# Patient Record
Sex: Female | Born: 1935 | Race: Black or African American | Hispanic: No | Smoking: Never smoker
Health system: Southern US, Community
[De-identification: ages and names within clinical notes are randomized; demographics above are authoritative.]

## PROBLEM LIST (undated history)

## (undated) DIAGNOSIS — Z972 Presence of dental prosthetic device (complete) (partial): Secondary | ICD-10-CM

## (undated) DIAGNOSIS — I10 Essential (primary) hypertension: Secondary | ICD-10-CM

## (undated) DIAGNOSIS — R42 Dizziness and giddiness: Secondary | ICD-10-CM

## (undated) DIAGNOSIS — M1990 Unspecified osteoarthritis, unspecified site: Secondary | ICD-10-CM

## (undated) HISTORY — PX: TOTAL HIP ARTHROPLASTY: SHX124

---

## 2004-05-19 ENCOUNTER — Ambulatory Visit: Payer: Self-pay | Admitting: Internal Medicine

## 2006-04-29 ENCOUNTER — Ambulatory Visit: Payer: Self-pay | Admitting: Internal Medicine

## 2007-05-18 ENCOUNTER — Ambulatory Visit: Payer: Self-pay | Admitting: Internal Medicine

## 2008-05-18 ENCOUNTER — Ambulatory Visit: Payer: Self-pay | Admitting: Internal Medicine

## 2008-10-15 ENCOUNTER — Encounter: Admission: RE | Admit: 2008-10-15 | Discharge: 2008-10-15 | Payer: Self-pay | Admitting: Rheumatology

## 2009-05-20 ENCOUNTER — Ambulatory Visit: Payer: Self-pay | Admitting: Internal Medicine

## 2010-05-26 ENCOUNTER — Ambulatory Visit: Payer: Self-pay | Admitting: Internal Medicine

## 2011-06-25 ENCOUNTER — Ambulatory Visit: Payer: Self-pay | Admitting: Internal Medicine

## 2012-06-27 ENCOUNTER — Ambulatory Visit: Payer: Self-pay | Admitting: Internal Medicine

## 2012-12-22 ENCOUNTER — Ambulatory Visit: Payer: Self-pay | Admitting: Ophthalmology

## 2013-01-04 ENCOUNTER — Ambulatory Visit: Payer: Self-pay | Admitting: Ophthalmology

## 2013-06-28 ENCOUNTER — Ambulatory Visit: Payer: Self-pay | Admitting: Internal Medicine

## 2014-06-29 ENCOUNTER — Ambulatory Visit: Payer: Self-pay | Admitting: Internal Medicine

## 2014-07-04 ENCOUNTER — Ambulatory Visit: Payer: Self-pay | Admitting: Internal Medicine

## 2014-11-16 NOTE — Op Note (Signed)
PATIENT NAME:  Regina FiedlerRILEY, Haddie R MR#:  [id number] DATE OF BIRTH:  1936-04-17  DATE OF PROCEDURE:  01/04/2013  PREOPERATIVE DIAGNOSIS:  Senile cataract, left eye.  POSTOPERATIVE DIAGNOSIS:  Senile cataract, left eye.  PROCEDURE:  Phacoemulsification with posterior chamber intraocular lens implantation of the left eye.  LENS:  ZCBOO 19.5-diopter posterior chamber intraocular lens.  ULTRASOUND TIME:  19% of 1 minutes, 41 seconds.  CDE 19.7.   SURGEON:  It[place]had Xandria Gallaga, MD  ANESTHESIA:  Topical with tetracaine drops and 2% Xylocaine jelly.  COMPLICATIONS:  None.  DESCRIPTION OF PROCEDURE:  The patient was identified in the holding room and transported to the operating room and placed in the supine position under the operating microscope.  The left eye was identified as the operative eye and it was prepped and draped in the usual sterile ophthalmic fashion.  A 1 millimeter clear-corneal paracentesis was made at the 1:30 position.  The anterior chamber was filled with Viscoat viscoelastic.  A 2.4 millimeter keratome was used to make a near-clear corneal incision at the 10:30 position.  A curvilinear capsulorrhexis was made with a cystotome and capsulorrhexis forceps.  Balanced salt solution was used to hydrodissect and hydrodelineate the nucleus.  Phacoemulsification was then used in stop and chop fashion to remove the lens nucleus and epinucleus.  The remaining cortex was then removed using the irrigation and aspiration handpiece. Provisc was then placed into the capsular bag to distend it for lens placement.  A ZCBOO 19.5 diopter lens was then injected into the capsular bag.  The remaining viscoelastic was aspirated.  Wounds were hydrated with balanced salt solution.  The anterior chamber was inflated to a physiologic pressure with balanced salt solution.  0.1 mL of cefuroxime 10 mg/mL were injected into the anterior chamber for a dose of 1 mg of intracameral antibiotic at the completion of the  case. Miostat was placed into the anterior chamber to constrict the pupil.  No wound leaks were noted.  Topical Vigamox drops and Maxitrol ointment were applied to the eye.  The patient was taken to the recovery room in stable condition without complications of anesthesia or surgery.    ____________________________ Deirdre Evenerhadwick R. Chanin Frumkin, MD crb:ea D: 01/04/2013 13:54:00 ET T: 01/04/2013 16:35:15 ET JOB#: [id number]  cc: Deirdre Evenerhadwick R. Glenyce Randle, MD, <Dictator>   Lockie MolaHADWICK Quran Vasco MD ELECTRONICALLY SIGNED 01/11/2013 12:09

## 2015-06-13 ENCOUNTER — Other Ambulatory Visit: Payer: Self-pay | Admitting: Internal Medicine

## 2015-06-13 DIAGNOSIS — Z1231 Encounter for screening mammogram for malignant neoplasm of breast: Secondary | ICD-10-CM

## 2015-07-09 ENCOUNTER — Ambulatory Visit
Admission: RE | Admit: 2015-07-09 | Discharge: 2015-07-09 | Disposition: A | Discharge status: Home / Self Care | Payer: Medicare Other | Source: Ambulatory Visit | Attending: Internal Medicine | Admitting: Internal Medicine

## 2015-07-09 ENCOUNTER — Other Ambulatory Visit: Payer: Self-pay | Admitting: Internal Medicine

## 2015-07-09 DIAGNOSIS — Z1231 Encounter for screening mammogram for malignant neoplasm of breast: Secondary | ICD-10-CM | POA: Diagnosis not present

## 2016-05-15 ENCOUNTER — Other Ambulatory Visit: Payer: Self-pay | Admitting: Internal Medicine

## 2016-05-15 DIAGNOSIS — Z1231 Encounter for screening mammogram for malignant neoplasm of breast: Secondary | ICD-10-CM

## 2016-05-20 IMAGING — US US BREAST*L* LIMITED INC AXILLA
1 series · 1 of 1 positions shown · non-contrast
Comparison: Prior mammograms

CLINICAL DATA: Questioned abnormality on 3D screening mammography
left breast 3 o'clock location, patient scheduled for ultrasound
only evaluation

EXAM:
ULTRASOUND OF THE left BREAST

[Series 1: us breast*left* limited inc axilla · 0.09mm/px · 1 of 1 slices shown]
[im 1/1]
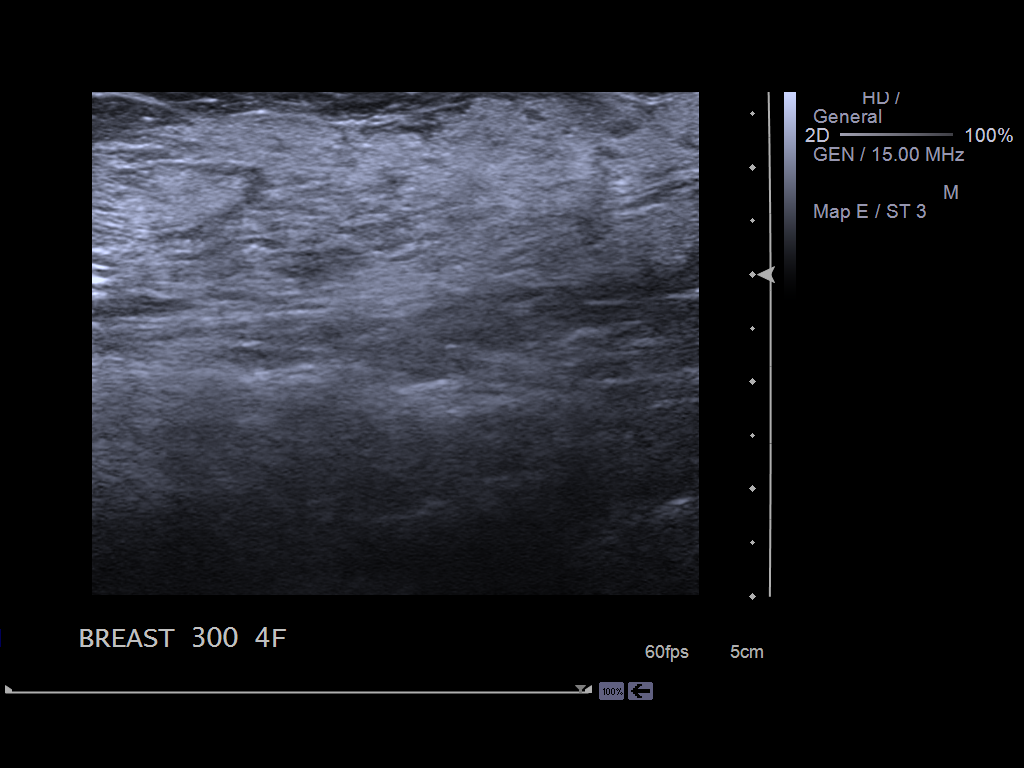

[1 of 1 positions shown; findings below may reference images not displayed]

FINDINGS: On physical exam, I palpate no abnormality in the left breast 3
o'clock location

Ultrasound is performed, showing no sonographic abnormality in the
left breast 3 o'clock location at the site of previously seen
nodular probable breast parenchyma with an overlying pattern of
heterogeneous breast parenchyma.
IMPRESSION: No evidence for malignancy in the left breast.

RECOMMENDATION:
Screening mammogram in one year.(Code:F9-X-W68)

I have discussed the findings and recommendations with the patient.
Results were also provided in writing at the conclusion of the
visit. If applicable, a reminder letter will be sent to the patient
regarding the next appointment.

BI-RADS CATEGORY  1: Negative.

## 2016-07-09 ENCOUNTER — Ambulatory Visit
Admission: RE | Admit: 2016-07-09 | Discharge: 2016-07-09 | Disposition: A | Discharge status: Home / Self Care | Payer: Medicare Other | Source: Ambulatory Visit | Attending: Internal Medicine | Admitting: Internal Medicine

## 2016-07-09 DIAGNOSIS — Z1231 Encounter for screening mammogram for malignant neoplasm of breast: Secondary | ICD-10-CM | POA: Insufficient documentation

## 2017-04-30 ENCOUNTER — Other Ambulatory Visit: Payer: Self-pay | Admitting: Internal Medicine

## 2017-04-30 DIAGNOSIS — Z1231 Encounter for screening mammogram for malignant neoplasm of breast: Secondary | ICD-10-CM

## 2017-08-03 ENCOUNTER — Ambulatory Visit
Admission: RE | Admit: 2017-08-03 | Discharge: 2017-08-03 | Disposition: A | Discharge status: Home / Self Care | Payer: Medicare Other | Source: Ambulatory Visit | Attending: Internal Medicine | Admitting: Internal Medicine

## 2017-08-03 DIAGNOSIS — Z1231 Encounter for screening mammogram for malignant neoplasm of breast: Secondary | ICD-10-CM

## 2018-02-28 ENCOUNTER — Encounter: Payer: Self-pay | Admitting: *Deleted

## 2018-02-28 ENCOUNTER — Other Ambulatory Visit: Payer: Self-pay

## 2018-03-01 ENCOUNTER — Encounter: Payer: Self-pay | Admitting: Anesthesiology

## 2018-03-04 NOTE — Discharge Instructions (Signed)

## 2018-03-08 ENCOUNTER — Ambulatory Visit: Admission: RE | Admit: 2018-03-08 | Payer: Medicare Other | Source: Ambulatory Visit | Admitting: Ophthalmology

## 2018-03-08 HISTORY — DX: Dizziness and giddiness: R42

## 2018-03-08 HISTORY — DX: Presence of dental prosthetic device (complete) (partial): Z97.2

## 2018-03-08 HISTORY — DX: Unspecified osteoarthritis, unspecified site: M19.90

## 2018-03-08 HISTORY — DX: Essential (primary) hypertension: I10

## 2018-03-08 SURGERY — PHACOEMULSIFICATION, CATARACT, WITH IOL INSERTION
Anesthesia: Topical | Laterality: Right

## 2019-02-23 ENCOUNTER — Encounter: Payer: Self-pay | Admitting: Anesthesiology

## 2019-02-23 ENCOUNTER — Encounter: Payer: Self-pay | Admitting: *Deleted

## 2019-02-23 ENCOUNTER — Other Ambulatory Visit: Payer: Self-pay

## 2019-02-23 NOTE — Discharge Instructions (Signed)

## 2019-02-24 ENCOUNTER — Other Ambulatory Visit
Admission: RE | Admit: 2019-02-24 | Discharge: 2019-02-24 | Disposition: A | Discharge status: Home / Self Care | Payer: Medicare Other | Source: Ambulatory Visit | Attending: Ophthalmology | Admitting: Ophthalmology

## 2019-02-24 DIAGNOSIS — Z01812 Encounter for preprocedural laboratory examination: Secondary | ICD-10-CM | POA: Insufficient documentation

## 2019-02-24 DIAGNOSIS — Z20828 Contact with and (suspected) exposure to other viral communicable diseases: Secondary | ICD-10-CM | POA: Diagnosis not present

## 2019-02-24 LAB — SARS CORONAVIRUS 2 (TAT 6-24 HRS): SARS Coronavirus 2: NEGATIVE

## 2019-03-01 ENCOUNTER — Ambulatory Visit: Admission: RE | Admit: 2019-03-01 | Payer: Medicare Other | Source: Home / Self Care | Admitting: Ophthalmology

## 2019-03-01 SURGERY — PHACOEMULSIFICATION, CATARACT, WITH IOL INSERTION
Anesthesia: Topical | Laterality: Right

## 2020-11-08 ENCOUNTER — Ambulatory Visit: Admit: 2020-11-08 | Payer: Medicare Other | Admitting: Ophthalmology

## 2020-11-08 SURGERY — PHACOEMULSIFICATION, CATARACT, WITH IOL INSERTION
Anesthesia: Topical | Laterality: Right

## 2021-02-05 ENCOUNTER — Encounter: Payer: Self-pay | Admitting: Ophthalmology

## 2021-02-17 NOTE — Discharge Instructions (Signed)

## 2021-02-19 ENCOUNTER — Encounter: Payer: Self-pay | Admitting: Ophthalmology

## 2021-02-19 ENCOUNTER — Ambulatory Visit: Payer: Medicare Other | Admitting: Anesthesiology

## 2021-02-19 ENCOUNTER — Other Ambulatory Visit: Payer: Self-pay

## 2021-02-19 ENCOUNTER — Ambulatory Visit
Admission: RE | Admit: 2021-02-19 | Discharge: 2021-02-19 | Disposition: A | Discharge status: Home / Self Care | Payer: Medicare Other | Attending: Ophthalmology | Admitting: Ophthalmology

## 2021-02-19 ENCOUNTER — Encounter
Admission: RE | Disposition: A | Discharge status: Home / Self Care | Payer: Self-pay | Source: Home / Self Care | Attending: Ophthalmology

## 2021-02-19 DIAGNOSIS — Z791 Long term (current) use of non-steroidal anti-inflammatories (NSAID): Secondary | ICD-10-CM | POA: Insufficient documentation

## 2021-02-19 DIAGNOSIS — Z96642 Presence of left artificial hip joint: Secondary | ICD-10-CM | POA: Diagnosis not present

## 2021-02-19 DIAGNOSIS — H2589 Other age-related cataract: Secondary | ICD-10-CM | POA: Diagnosis present

## 2021-02-19 DIAGNOSIS — Z7952 Long term (current) use of systemic steroids: Secondary | ICD-10-CM | POA: Diagnosis not present

## 2021-02-19 DIAGNOSIS — Z7984 Long term (current) use of oral hypoglycemic drugs: Secondary | ICD-10-CM | POA: Insufficient documentation

## 2021-02-19 HISTORY — PX: CATARACT EXTRACTION W/PHACO: SHX586

## 2021-02-19 LAB — GLUCOSE, CAPILLARY: Glucose-Capillary: 108 mg/dL — ABNORMAL HIGH (ref 70–99)

## 2021-02-19 SURGERY — PHACOEMULSIFICATION, CATARACT, WITH IOL INSERTION
Anesthesia: Monitor Anesthesia Care | Site: Eye | Laterality: Right

## 2021-02-19 MED ORDER — SIGHTPATH DOSE#1 BSS IO SOLN
INTRAOCULAR | Status: DC | PRN
  Administered 2021-02-19: 2 mL

## 2021-02-19 MED ORDER — PHENYLEPHRINE HCL 10 % OP SOLN
1.0000 [drp] | OPHTHALMIC | Status: DC | PRN
  Administered 2021-02-19 (×3): 1 [drp] via OPHTHALMIC

## 2021-02-19 MED ORDER — CEFUROXIME OPHTHALMIC INJECTION 1 MG/0.1 ML
INJECTION | OPHTHALMIC | Status: DC | PRN
  Administered 2021-02-19: 0.1 mL via INTRACAMERAL

## 2021-02-19 MED ORDER — MIDAZOLAM HCL 2 MG/2ML IJ SOLN
INTRAMUSCULAR | Status: DC | PRN
  Administered 2021-02-19: .5 mg via INTRAVENOUS

## 2021-02-19 MED ORDER — SIGHTPATH DOSE#1 BSS IO SOLN
INTRAOCULAR | Status: DC | PRN
  Administered 2021-02-19: 132 mL via OPHTHALMIC

## 2021-02-19 MED ORDER — SIGHTPATH DOSE#1 BSS IO SOLN
INTRAOCULAR | Status: DC | PRN
  Administered 2021-02-19: 15 mL via INTRAOCULAR

## 2021-02-19 MED ORDER — BRIMONIDINE TARTRATE-TIMOLOL 0.2-0.5 % OP SOLN
OPHTHALMIC | Status: DC | PRN
  Administered 2021-02-19: 1 [drp] via OPHTHALMIC

## 2021-02-19 MED ORDER — SIGHTPATH DOSE#1 NA HYALUR & NA CHOND-NA HYALUR IO KIT
PACK | INTRAOCULAR | Status: DC | PRN
  Administered 2021-02-19: 1 via OPHTHALMIC

## 2021-02-19 MED ORDER — FENTANYL CITRATE (PF) 100 MCG/2ML IJ SOLN
INTRAMUSCULAR | Status: DC | PRN
  Administered 2021-02-19: 50 ug via INTRAVENOUS

## 2021-02-19 MED ORDER — TETRACAINE HCL 0.5 % OP SOLN
1.0000 [drp] | OPHTHALMIC | Status: DC | PRN
  Administered 2021-02-19 (×3): 1 [drp] via OPHTHALMIC

## 2021-02-19 MED ORDER — LACTATED RINGERS IV SOLN: INTRAVENOUS | Status: DC

## 2021-02-19 MED ORDER — TRYPAN BLUE 0.06 % OP SOLN
OPHTHALMIC | Status: DC | PRN
  Administered 2021-02-19: 0.5 mL via INTRAOCULAR

## 2021-02-19 MED ORDER — SIGHTPATH DOSE#1 SODIUM HYALURONATE 23 MG/ML IO SOLUTION
PREFILLED_SYRINGE | INTRAOCULAR | Status: DC | PRN
  Administered 2021-02-19: 0.55 mL via INTRAOCULAR

## 2021-02-19 MED ORDER — BSS IO SOLN
INTRAOCULAR | Status: DC | PRN
  Administered 2021-02-19 (×2): 15 mL via INTRAOCULAR

## 2021-02-19 MED ORDER — CYCLOPENTOLATE HCL 2 % OP SOLN
1.0000 [drp] | OPHTHALMIC | Status: DC | PRN
  Administered 2021-02-19 (×3): 1 [drp] via OPHTHALMIC

## 2021-02-19 SURGICAL SUPPLY — 15 items
CANNULA ANT/CHMB 27GA (MISCELLANEOUS) ×4 IMPLANT
GLOVE SURG GAMMEX PI TX LF 7.5 (GLOVE) ×2 IMPLANT
GLOVE SURG TRIUMPH 8.0 PF LTX (GLOVE) ×2 IMPLANT
GOWN STRL REUS W/ TWL LRG LVL3 (GOWN DISPOSABLE) ×2 IMPLANT
GOWN STRL REUS W/TWL LRG LVL3 (GOWN DISPOSABLE) ×4
LENS IOL TECNIS EYHANCE 20.5 (Intraocular Lens) ×2 IMPLANT
MARKER SKIN DUAL TIP RULER LAB (MISCELLANEOUS) ×2 IMPLANT
NEEDLE CAPSULORHEX 25GA (NEEDLE) ×2 IMPLANT
NEEDLE FILTER BLUNT 18X 1/2SAF (NEEDLE) ×2
NEEDLE FILTER BLUNT 18X1 1/2 (NEEDLE) ×2 IMPLANT
PACK EYE AFTER SURG (MISCELLANEOUS) ×2 IMPLANT
SYR 3ML LL SCALE MARK (SYRINGE) ×4 IMPLANT
SYR TB 1ML LUER SLIP (SYRINGE) ×2 IMPLANT
WATER STERILE IRR 250ML POUR (IV SOLUTION) ×2 IMPLANT
WIPE NON LINTING 3.25X3.25 (MISCELLANEOUS) ×2 IMPLANT

## 2021-02-19 NOTE — Transfer of Care (Signed)
Immediate Anesthesia Transfer of Care Note  Patient: Regina Ross  Procedure(s) Performed: CATARACT EXTRACTION PHACO AND INTRAOCULAR LENS PLACEMENT (IOC) RIGHT HEALON 5 VISION BLUE 38.18 03:28.4 (Right: Eye)  Patient Location: PACU  Anesthesia Type: MAC  Level of Consciousness: awake, alert  and patient cooperative  Airway and Oxygen Therapy: Patient Spontanous Breathing and Patient connected to supplemental oxygen  Post-op Assessment: Post-op Vital signs reviewed, Patient's Cardiovascular Status Stable, Respiratory Function Stable, Patent Airway and No signs of Nausea or vomiting  Post-op Vital Signs: Reviewed and stable  Complications: No notable events documented.

## 2021-02-19 NOTE — Anesthesia Postprocedure Evaluation (Signed)
Anesthesia Post Note  Patient: Regina Ross  Procedure(s) Performed: CATARACT EXTRACTION PHACO AND INTRAOCULAR LENS PLACEMENT (IOC) RIGHT HEALON 5 VISION BLUE 38.18 03:28.4 (Right: Eye)     Patient location during evaluation: PACU Anesthesia Type: MAC Level of consciousness: awake and alert Pain management: pain level controlled Vital Signs Assessment: post-procedure vital signs reviewed and stable Respiratory status: spontaneous breathing Cardiovascular status: stable Anesthetic complications: no   No notable events documented.  Gillian Scarce

## 2021-02-19 NOTE — H&P (Signed)
[facility]   Primary Care Physician:  Armando Gang, FNP Ophthalmologist: Dr. Lockie Mola  Pre-Procedure History & Physical: HPI:  Regina Ross is a 85 y.o. female here for ophthalmic surgery.   Past Medical History:  Diagnosis Date   Arthritis    right knee, right hip   Hypertension    Vertigo    Wears dentures    full upper and lower    Past Surgical History:  Procedure Laterality Date   TOTAL HIP ARTHROPLASTY Left     Prior to Admission medications   Medication Sig Start Date End Date Taking? Authorizing Provider  atenolol (TENORMIN) 50 MG tablet Take 50 mg by mouth daily.   Yes [provider]  Cyanocobalamin (B-12 PO) Take 3,000 mg by mouth daily.   Yes [provider]  dapagliflozin propanediol (FARXIGA) 5 MG TABS tablet Take 5 mg by mouth daily.   Yes [provider]  doxazosin (CARDURA) 4 MG tablet Take 4 mg by mouth daily.   Yes [provider]  etodolac (LODINE) 200 MG capsule Take 200 mg by mouth 2 (two) times daily.   Yes [provider]  meclizine (ANTIVERT) 25 MG tablet Take 25 mg by mouth daily.   Yes [provider]  naproxen (NAPROSYN) 500 MG tablet Take 500 mg by mouth 2 (two) times daily with a meal.   Yes [provider]  simvastatin (ZOCOR) 20 MG tablet Take 20 mg by mouth daily.   Yes [provider]  TIMOLOL MALEATE OP Apply to eye 2 (two) times daily.   Yes [provider]  VITAMIN D PO Take by mouth daily.   Yes [provider]  predniSONE (DELTASONE) 2.5 MG tablet Take 2.5 mg by mouth daily with breakfast. Patient not taking: Reported on 02/05/2021    [provider]    Allergies as of 01/08/2021   (No Known Allergies)    Family History  Problem Relation Age of Onset   Breast cancer Mother 70    Social History   Socioeconomic History   Marital status: Widowed    Spouse name: Not on file   Number of children: Not on  file   Years of education: Not on file   Highest education level: Not on file  Occupational History   Not on file  Tobacco Use   Smoking status: Never   Smokeless tobacco: Never  Vaping Use   Vaping Use: Never used  Substance and Sexual Activity   Alcohol use: Never   Drug use: Not on file   Sexual activity: Not on file  Other Topics Concern   Not on file  Social History Narrative   Not on file   Social Determinants of Health   Financial Resource Strain: Not on file  Food Insecurity: Not on file  Transportation Needs: Not on file  Physical Activity: Not on file  Stress: Not on file  Social Connections: Not on file  Intimate Partner Violence: Not on file    Review of Systems: See HPI, otherwise negative ROS  Physical Exam: BP (!) 194/78   Pulse 63   Temp (!) 97.5 F (36.4 C)   Ht 5\' 7"  (1.702 m)   Wt 73.9 kg   SpO2 100%   BMI 25.53 kg/m  General:   Alert,  pleasant and cooperative in NAD Head:  Normocephalic and atraumatic. Lungs:  Clear to auscultation.    Heart:  Regular rate and rhythm. Gr IV murmur  Impression/Plan: Regina Ross is here for ophthalmic surgery.  Risks, benefits, limitations, and alternatives regarding ophthalmic surgery have been reviewed with the patient.  Questions have been answered.  All parties agreeable.   Lockie Mola, MD  02/19/2021, 8:53 AM

## 2021-02-19 NOTE — Anesthesia Procedure Notes (Signed)
Procedure Name: MAC Date/Time: 02/19/2021 9:24 AM Performed by: Dionne Bucy, CRNA Pre-anesthesia Checklist: Patient identified, Emergency Drugs available, Suction available, Patient being monitored and Timeout performed Patient Re-evaluated:Patient Re-evaluated prior to induction Oxygen Delivery Method: Nasal cannula Placement Confirmation: positive ETCO2

## 2021-02-19 NOTE — Op Note (Signed)
OPERATIVE NOTE  Regina Ross [id number] 02/19/2021   PREOPERATIVE DIAGNOSIS:  H25.89 Cataract           Mature (Total) Cataract Right Eye H25.89   POSTOPERATIVE DIAGNOSIS: H25.89 Cataract           Mature (Total) Cataract Right Eye H25.89          PROCEDURE:  Phacoemusification with posterior chamber intraocular lens placement of the right eye .  Vision Blue dye was used to stain the lens capsule.  LENS:   Implant Name Type Inv. Item Serial No. Manufacturer Lot No. LRB No. Used Action  LENS IOL TECNIS EYHANCE 20.5 - [id number] Intraocular Lens LENS IOL TECNIS EYHANCE 20.5 [id number] [id number]   Right 1 Implanted       ULTRASOUND TIME:  3 minutes 28 seconds, CDE 38.2  SURGEON:  Deirdre Evener, MD   ANESTHESIA:  Topical with tetracaine drops and 2% Xylocaine jelly, augmented with 1% preservative-free intracameral lidocaine.       COMPLICATIONS:  None.   DESCRIPTION OF PROCEDURE:  The patient was identified in the holding room and transported to the operating room and placed in the supine position under the operating microscope.  The right eye was identified as the operative eye and it was prepped and draped in the usual sterile ophthalmic fashion.    A 1 millimeter clear-corneal paracentesis was made at the 12:00 position.  0.5 ml of preservative-free 1% lidocaine was injected into the anterior chamber. The anterior chamber was filled with Healon 5 viscoelastic.  A 2.4 millimeter keratome was used to make a near-clear corneal incision at the 9:00 position.  The anterior chamber was filled with Healon 5 viscoelastic.  Vision Blue dye was then injected under the viscoelastic to stain the lens capsule.  BSS was then used to wash the dye out.  Additional Healon 5 was placed into the anterior chamber.  A curvilinear capsulorrhexis was made with a cystotome and capsulorrhexis forceps.  Balanced salt solution was used to hydrodissect and hydrodelineate the nucleus.  Viscoat was  then placed in the anterior chamber.   Phacoemulsification was then used in stop and chop fashion to remove the lens nucleus and epinucleus.  The remaining cortex was then removed using the irrigation and aspiration handpiece. Provisc was then placed into the capsular bag to distend it for lens placement.  A 20.5 -diopter lens was then injected into the capsular bag.  The remaining viscoelastic was aspirated.   Wounds were hydrated with balanced salt solution.  The anterior chamber was inflated to a physiologic pressure with balanced salt solution. Cefuroxime 0.1 ml of a 10mg /ml solution was injected into the anterior chamber for a dose of 1 mg of intracameral antibiotic at the completion of the case.  No wound leaks were noted.  Topical combigan drops were applied to the eye.  The patient was taken to the recovery room in stable condition without complications of anesthesia or surgery.  [name] 02/19/2021, 9:48 AM

## 2021-02-19 NOTE — Anesthesia Preprocedure Evaluation (Addendum)
Anesthesia Evaluation  Patient identified by MRN, date of birth, ID band Patient awake    Reviewed: Allergy & Precautions, H&P , NPO status , Patient's Chart, lab work & pertinent test results  Airway Mallampati: II  TM Distance: >3 FB Neck ROM: full    Dental no notable dental hx.    Pulmonary neg pulmonary ROS,    Pulmonary exam normal        Cardiovascular hypertension, On Medications  Rhythm:regular Rate:Normal + Systolic murmurs    Neuro/Psych negative neurological ROS  negative psych ROS   GI/Hepatic negative GI ROS, Neg liver ROS,   Endo/Other  negative endocrine ROS  Renal/GU negative Renal ROS  negative genitourinary   Musculoskeletal   Abdominal   Peds  Hematology negative hematology ROS (+)   Anesthesia Other Findings   Reproductive/Obstetrics                            Anesthesia Physical Anesthesia Plan  ASA: 2  Anesthesia Plan: MAC   Post-op Pain Management:    Induction:   PONV Risk Score and Plan: 2 and Treatment may vary due to age or medical condition and Midazolam  Airway Management Planned:   Additional Equipment:   Intra-op Plan:   Post-operative Plan:   Informed Consent: I have reviewed the patients History and Physical, chart, labs and discussed the procedure including the risks, benefits and alternatives for the proposed anesthesia with the patient or authorized representative who has indicated his/her understanding and acceptance.       Plan Discussed with:   Anesthesia Plan Comments:         Anesthesia Quick Evaluation

## 2021-02-20 ENCOUNTER — Encounter: Payer: Self-pay | Admitting: Ophthalmology

## 2021-02-20 MED ORDER — SODIUM HYALURONATE 23MG/ML IO SOSY
PREFILLED_SYRINGE | INTRAOCULAR | Status: DC | PRN
  Administered 2021-02-19: 0.6 mL via INTRAOCULAR

## 2021-02-20 NOTE — OR Nursing (Signed)
Changed Sightpath healon 5 dose #1 to the chargeable Healon 5 code.

## 2022-01-14 ENCOUNTER — Ambulatory Visit: Payer: Medicare Other | Admitting: Internal Medicine

## 2023-11-09 ENCOUNTER — Other Ambulatory Visit: Payer: Self-pay

## 2023-11-09 ENCOUNTER — Emergency Department

## 2023-11-09 ENCOUNTER — Emergency Department
Admission: EM | Admit: 2023-11-09 | Discharge: 2023-11-09 | Disposition: A | Discharge status: Home / Self Care | Attending: Emergency Medicine | Admitting: Emergency Medicine

## 2023-11-09 DIAGNOSIS — I491 Atrial premature depolarization: Secondary | ICD-10-CM | POA: Diagnosis not present

## 2023-11-09 DIAGNOSIS — R410 Disorientation, unspecified: Secondary | ICD-10-CM | POA: Diagnosis not present

## 2023-11-09 DIAGNOSIS — R471 Dysarthria and anarthria: Secondary | ICD-10-CM | POA: Diagnosis not present

## 2023-11-09 DIAGNOSIS — I700 Atherosclerosis of aorta: Secondary | ICD-10-CM | POA: Diagnosis not present

## 2023-11-09 DIAGNOSIS — R4182 Altered mental status, unspecified: Secondary | ICD-10-CM | POA: Diagnosis present

## 2023-11-09 DIAGNOSIS — R4701 Aphasia: Secondary | ICD-10-CM | POA: Insufficient documentation

## 2023-11-09 DIAGNOSIS — Z8673 Personal history of transient ischemic attack (TIA), and cerebral infarction without residual deficits: Secondary | ICD-10-CM | POA: Diagnosis not present

## 2023-11-09 DIAGNOSIS — Z993 Dependence on wheelchair: Secondary | ICD-10-CM | POA: Insufficient documentation

## 2023-11-09 DIAGNOSIS — R531 Weakness: Secondary | ICD-10-CM | POA: Diagnosis not present

## 2023-11-09 DIAGNOSIS — E119 Type 2 diabetes mellitus without complications: Secondary | ICD-10-CM | POA: Insufficient documentation

## 2023-11-09 DIAGNOSIS — I10 Essential (primary) hypertension: Secondary | ICD-10-CM | POA: Insufficient documentation

## 2023-11-09 DIAGNOSIS — R000 Tachycardia, unspecified: Secondary | ICD-10-CM | POA: Insufficient documentation

## 2023-11-09 DIAGNOSIS — I6782 Cerebral ischemia: Secondary | ICD-10-CM | POA: Insufficient documentation

## 2023-11-09 LAB — URINE DRUG SCREEN, QUALITATIVE (ARMC ONLY)
Amphetamines, Ur Screen: NOT DETECTED
Barbiturates, Ur Screen: NOT DETECTED
Benzodiazepine, Ur Scrn: NOT DETECTED
Cannabinoid 50 Ng, Ur ~~LOC~~: NOT DETECTED
Cocaine Metabolite,Ur ~~LOC~~: NOT DETECTED
MDMA (Ecstasy)Ur Screen: NOT DETECTED
Methadone Scn, Ur: NOT DETECTED
Opiate, Ur Screen: NOT DETECTED
Phencyclidine (PCP) Ur S: NOT DETECTED
Tricyclic, Ur Screen: NOT DETECTED

## 2023-11-09 LAB — URINALYSIS, W/ REFLEX TO CULTURE (INFECTION SUSPECTED)
Bacteria, UA: NONE SEEN
Bilirubin Urine: NEGATIVE
Glucose, UA: NEGATIVE mg/dL
Ketones, ur: 5 mg/dL — AB
Leukocytes,Ua: NEGATIVE
Nitrite: NEGATIVE
Protein, ur: NEGATIVE mg/dL
Specific Gravity, Urine: 1.044 — ABNORMAL HIGH (ref 1.005–1.030)
pH: 6 (ref 5.0–8.0)

## 2023-11-09 LAB — ETHANOL: Alcohol, Ethyl (B): <10 mg/dL (ref <10)

## 2023-11-09 LAB — CBC WITH DIFFERENTIAL/PLATELET
Abs Immature Granulocytes: 0.07 10*3/uL (ref 0.00–0.07)
Basophils Absolute: 0 10*3/uL (ref 0.0–0.1)
Basophils Relative: 0 %
Eosinophils Absolute: 0 10*3/uL (ref 0.0–0.5)
Eosinophils Relative: 0 %
HCT: 36.6 % (ref 36.0–46.0)
Hemoglobin: 12.3 g/dL (ref 12.0–15.0)
Immature Granulocytes: 1 %
Lymphocytes Relative: 11 %
Lymphs Abs: 1.1 10*3/uL (ref 0.7–4.0)
MCH: 31.4 pg (ref 26.0–34.0)
MCHC: 33.6 g/dL (ref 30.0–36.0)
MCV: 93.4 fL (ref 80.0–100.0)
Monocytes Absolute: 1.2 10*3/uL — ABNORMAL HIGH (ref 0.1–1.0)
Monocytes Relative: 11 %
Neutro Abs: 8 10*3/uL — ABNORMAL HIGH (ref 1.7–7.7)
Neutrophils Relative %: 77 %
Platelets: 232 10*3/uL (ref 150–400)
RBC: 3.92 MIL/uL (ref 3.87–5.11)
RDW: 12.9 % (ref 11.5–15.5)
WBC: 10.4 10*3/uL (ref 4.0–10.5)
nRBC: 0 % (ref 0.0–0.2)

## 2023-11-09 LAB — RESP PANEL BY RT-PCR (RSV, FLU A&B, COVID)  RVPGX2
Influenza A by PCR: NEGATIVE
Influenza B by PCR: NEGATIVE
Resp Syncytial Virus by PCR: NEGATIVE
SARS Coronavirus 2 by RT PCR: NEGATIVE

## 2023-11-09 LAB — COMPREHENSIVE METABOLIC PANEL WITH GFR
ALT: 10 U/L (ref 0–44)
AST: 23 U/L (ref 15–41)
Albumin: 3.7 g/dL (ref 3.5–5.0)
Alkaline Phosphatase: 58 U/L (ref 38–126)
Anion gap: 11 (ref 5–15)
BUN: 17 mg/dL (ref 8–23)
CO2: 24 mmol/L (ref 22–32)
Calcium: 9.4 mg/dL (ref 8.9–10.3)
Chloride: 103 mmol/L (ref 98–111)
Creatinine, Ser: 1.05 mg/dL — ABNORMAL HIGH (ref 0.44–1.00)
GFR, Estimated: 52 mL/min — ABNORMAL LOW (ref >60)
Glucose, Bld: 166 mg/dL — ABNORMAL HIGH (ref 70–99)
Potassium: 4 mmol/L (ref 3.5–5.1)
Sodium: 138 mmol/L (ref 135–145)
Total Bilirubin: 1.2 mg/dL (ref 0.0–1.2)
Total Protein: 7.7 g/dL (ref 6.5–8.1)

## 2023-11-09 LAB — APTT: aPTT: 28 s (ref 24–36)

## 2023-11-09 LAB — PROTIME-INR
INR: 1.1 (ref 0.8–1.2)
Prothrombin Time: 14.5 s (ref 11.4–15.2)

## 2023-11-09 LAB — PROCALCITONIN: Procalcitonin: <0.1 ng/mL

## 2023-11-09 MED ORDER — IOHEXOL 350 MG/ML SOLN
100.0000 mL | Freq: Once | INTRAVENOUS | Status: AC | PRN
Start: 2023-11-09 — End: 2023-11-09
  Administered 2023-11-09: 100 mL via INTRAVENOUS

## 2023-11-09 MED ORDER — LACTATED RINGERS IV BOLUS
1000.0000 mL | Freq: Once | INTRAVENOUS | Status: AC
  Administered 2023-11-09: 1000 mL via INTRAVENOUS

## 2023-11-09 NOTE — Code Documentation (Signed)
 Stroke Response Nurse Documentation Code Documentation  Regina Ross is a 87 y.o. female arriving to [facility] via [facility] on 11/09/2023  with past medical hx of HTN, vertigo, right eye cataract extraction, wheelchair bound. On No antithrombotic. Code stroke was activated by ED.   Patient from home where she was LKW at 2130 on 11/08/2023 and now complaining of speech difficulties . Per nephew, patient was last seen in her usual state of health last night. This morning, family noticed that she wasn't making sense as she normally does.  Family reports history of right leg pain, being followed by ortho.  Stroke team at the bedside on patient arrival. Labs drawn and patient cleared for CT by Dr. Angus Kenning. Patient to CT with team. NIHSS 6, see documentation for details and code stroke times. Patient with disoriented, right gaze preference , right leg weakness, and Expressive aphasia  on exam. The following imaging was completed:  CT Head, CTA, and CTP. Patient is not a candidate for IV Thrombolytic due to outside window, per MD. Patient is not a candidate for IR due to no LVO on imaging, per MD.   Care Plan: every 2 hour NIHSS and vital signs. Swallow screen per protocol.   Process Delays Noted: none  Bedside handoff with ED RN Kay Parson  Stroke Response RN

## 2023-11-09 NOTE — ED Triage Notes (Signed)
 Pt here via [facility] with AMS, LKW was last night before bed at 2100. Pt was found sitting on her portable commode for 2 hrs by family. Pt currently has 2 bleeding bed sores per ems. Pt alert to name and place but not time. Pt states she has a hx of a stroke she thinks.   97.2 120 HR 97% RA 30-co2 25-30 RR 170-cbg

## 2023-11-09 NOTE — ED Provider Notes (Signed)
 [facility] Provider Note    Event Date/Time   First MD Initiated Contact with Patient 11/09/23 1034     (approximate)   History   Chief Complaint: Altered Mental Status   HPI  Regina Ross is a 88 y.o. female with history of hypertension, prior stroke who was brought to the ED due to altered mental status.  History primarily obtained from EMS who reports that patient was found by her family at this morning on her bedside commode, seemed confused.  Last known normal was 9:30 PM last night.        Past Medical History:  Diagnosis Date   Arthritis    right knee, right hip   Hypertension    Vertigo    Wears dentures    full upper and lower    Current Outpatient Rx   Order #: [id number] Class: Historical Med   Order #: [id number] Class: Historical Med   Order #: [id number] Class: Historical Med   Order #: [id number] Class: Historical Med   Order #: [id number] Class: Historical Med   Order #: [id number] Class: Historical Med   Order #: [id number] Class: Historical Med   Order #: [id number] Class: Historical Med   Order #: [id number] Class: Historical Med   Order #: [id number] Class: Historical Med   Order #: [id number] Class: Historical Med    Past Surgical History:  Procedure Laterality Date   CATARACT EXTRACTION W/PHACO Right 02/19/2021   Procedure: CATARACT EXTRACTION PHACO AND INTRAOCULAR LENS PLACEMENT (IOC) RIGHT HEALON 5 VISION BLUE 38.18 03:28.4;  Surgeon: Lockie Mola, MD;  Location: [facility];  Service: Ophthalmology;  Laterality: Right;   TOTAL HIP ARTHROPLASTY Left     Physical Exam   Triage Vital Signs: ED Triage Vitals [11/09/23 1030]  Encounter Vitals Group     BP      Systolic BP Percentile      Diastolic BP Percentile      Pulse      Resp      Temp      Temp src      SpO2      Weight 162 lb 14.7 oz (73.9 kg)     Height 5\' 7"  (1.702 m)     Head Circumference      Peak Flow      Pain Score      Pain Loc      Pain Education       Exclude from Growth Chart     Most recent vital signs: Vitals:   11/09/23 1200 11/09/23 1230  BP: (!) 168/74 (!) 164/67  Pulse: 98 92  Resp: 16 (!) 32  Temp:    SpO2: 96% 95%    General: Awake, no distress.  Oriented to self CV:  Good peripheral perfusion.  Regular rate and rhythm Resp:  Normal effort.  Clear to auscultation bilaterally Abd:  No distention.  Soft nontender Other:  Cranial nerves III through XII intact. Unable to resist gravity in the right lower extremity. Aphasia, word finding difficulty.  Able to use yes no answers.  There are small stage II sacral decubitus ulcers over bilateral sacrum, do not appear infected.   ED Results / Procedures / Treatments   Labs (all labs ordered are listed, but only abnormal results are displayed) Labs Reviewed  COMPREHENSIVE METABOLIC PANEL WITH GFR - Abnormal; Notable for the following components:      Result Value   Glucose, Bld 166 (*)    Creatinine, Ser 1.05 (*)    GFR, Estimated 52 (*)  All other components within normal limits  CBC WITH DIFFERENTIAL/PLATELET - Abnormal; Notable for the following components:   Neutro Abs 8.0 (*)    Monocytes Absolute 1.2 (*)    All other components within normal limits  RESP PANEL BY RT-PCR (RSV, FLU A&B, COVID)  RVPGX2  ETHANOL  PROTIME-INR  APTT  PROCALCITONIN  URINE DRUG SCREEN, QUALITATIVE ([facility] ONLY)  URINALYSIS, W/ REFLEX TO CULTURE (INFECTION SUSPECTED)     EKG Interpreted by me Sinus tachycardia rate 108.  Normal axis intervals QRS ST segments and T waves   RADIOLOGY Chest x-ray interpreted by me, appears unremarkable.  Radiology report reviewed   PROCEDURES:  Procedures   MEDICATIONS ORDERED IN ED: Medications  iohexol (OMNIPAQUE) 350 MG/ML injection 100 mL (100 mLs Intravenous Contrast Given 11/09/23 1117)     IMPRESSION / MDM / ASSESSMENT AND PLAN / ED COURSE  I reviewed the triage vital signs and the nursing notes.  DDx: Ischemic stroke,  UTI, COVID, influenza, pneumonia, electrolyte derangement, anemia, intracranial hemorrhage  Patient's presentation is most consistent with acute presentation with potential threat to life or bodily function.  Patient presents with altered mental status, aphasia, right lower extremity weakness.  NIH stroke scale 4 last known well 9:30 PM last night, will initiate code stroke for urgent evaluation of potential LVO symptoms.  Will also start workup to pursue potential metabolic causes of her symptoms.  Airway is intact.   ----------------------------------------- 12:06 PM on 11/09/2023 ----------------------------------------- Discussed with neurology who notes concern for stroke versus metabolic encephalopathy. Awaiting stat MRI.  ----------------------------------------- 3:02 PM on 11/09/2023 ----------------------------------------- MRI negative for acute findings.  Will follow-up urinalysis for evidence of cystitis.  If negative, I would suspect a more functional condition such as fatigue or mild viral illness, and patient may be stable for discharge      FINAL CLINICAL IMPRESSION(S) / ED DIAGNOSES   Final diagnoses:  Confusion  Type 2 diabetes mellitus without complication, without long-term current use of insulin (HCC)     Rx / DC Orders   ED Discharge Orders     None        Note:  This document was prepared using Dragon voice recognition software and may include unintentional dictation errors.   Jacquie Maudlin, MD 11/09/23 [phone]

## 2023-11-09 NOTE — ED Notes (Signed)
Code  stroke  called  to  [facility] 

## 2023-11-09 NOTE — Consult Note (Addendum)
 NEUROLOGY CONSULT NOTE   Date of service: November 09, 2023 Patient Name: Regina Ross MRN:  [id number] DOB:  1936-04-06 Chief Complaint: "Code stroke-speech difficulty" Requesting Provider: Sharman Cheek, MD  History of Present Illness  Regina Ross is a 88 y.o. female with hx of hypertension, vertigo, arthritis presented to the emergency department for evaluation of speech changes. According to the nephew who later came in, she was in her usual state of health somewhere at around 9:30 PM last night.  She is usually wheelchair-bound due to arthritis.  She was awake this morning but her speech was not making any sense which made the family bring him to the ER for further evaluation.  A code stroke was activated because of her aphasia.  She did not have weakness in the upper extremity but has lingering chronic weakness in the right lower extremity along with pain ever since a fall on October 29, 2023.  She follows with orthopedics. Patient is not able to provide reliable history at this time  LKW: 9:30 PM on 11/08/2023 Modified rankin score: 4-Needs assistance to walk and tend to bodily needs IV Thrombolysis: Outside the window EVT: No LVO  NIHSS components Score: Comment  1a Level of Conscious 0[x]  1[]  2[]  3[]      1b LOC Questions 0[]  1[]  2[x]       1c LOC Commands 0[x]  1[]  2[]       2 Best Gaze 0[]  1[x]  2[]       3 Visual 0[x]  1[]  2[]  3[]      4 Facial Palsy 0[x]  1[]  2[]  3[]      5a Motor Arm - left 0[x]  1[]  2[]  3[]  4[]  UN[]    5b Motor Arm - Right 0[x]  1[]  2[]  3[]  4[]  UN[]    6a Motor Leg - Left 0[x]  1[]  2[]  3[]  4[]  UN[]    6b Motor Leg - Right 0[]  1[]  2[x]  3[]  4[]  UN[]    7 Limb Ataxia 0[x]  1[]  2[]  3[]  UN[]     8 Sensory 0[x]  1[]  2[]  UN[]      9 Best Language 0[]  1[x]  2[]  3[]      10 Dysarthria 0[]  1[x]  2[]  UN[]      11 Extinct. and Inattention 0[x]  1[]  2[]       TOTAL: 7      ROS  Comprehensive ROS performed and pertinent positives documented in HPI   Past History   Past Medical  History:  Diagnosis Date   Arthritis    right knee, right hip   Hypertension    Vertigo    Wears dentures    full upper and lower    Past Surgical History:  Procedure Laterality Date   CATARACT EXTRACTION W/PHACO Right 02/19/2021   Procedure: CATARACT EXTRACTION PHACO AND INTRAOCULAR LENS PLACEMENT (IOC) RIGHT HEALON 5 VISION BLUE 38.18 03:28.4;  Surgeon: Lockie Mola, MD;  Location: [facility];  Service: Ophthalmology;  Laterality: Right;   TOTAL HIP ARTHROPLASTY Left     Family History: Family History  Problem Relation Age of Onset   Breast cancer Mother 62    Social History  reports that she has never smoked. She has never used smokeless tobacco. She reports that she does not drink alcohol. No history on file for drug use.  No Known Allergies  Medications  No current facility-administered medications for this encounter.  Current Outpatient Medications:    atenolol (TENORMIN) 50 MG tablet, Take 50 mg by mouth daily., Disp: , Rfl:    Cyanocobalamin (B-12 PO), Take 3,000 mg by mouth daily., Disp: , Rfl:  dapagliflozin propanediol (FARXIGA) 5 MG TABS tablet, Take 5 mg by mouth daily., Disp: , Rfl:    doxazosin (CARDURA) 4 MG tablet, Take 4 mg by mouth daily., Disp: , Rfl:    etodolac (LODINE) 200 MG capsule, Take 200 mg by mouth 2 (two) times daily., Disp: , Rfl:    meclizine (ANTIVERT) 25 MG tablet, Take 25 mg by mouth daily., Disp: , Rfl:    naproxen (NAPROSYN) 500 MG tablet, Take 500 mg by mouth 2 (two) times daily with a meal., Disp: , Rfl:    predniSONE (DELTASONE) 2.5 MG tablet, Take 2.5 mg by mouth daily with breakfast. (Patient not taking: Reported on 02/05/2021), Disp: , Rfl:    simvastatin (ZOCOR) 20 MG tablet, Take 20 mg by mouth daily., Disp: , Rfl:    TIMOLOL MALEATE OP, Apply to eye 2 (two) times daily., Disp: , Rfl:    VITAMIN D PO, Take by mouth daily., Disp: , Rfl:   Vitals   Vitals:   Nov 12, 2023 1035 11/12/23 1038 11-12-23 1040 11/12/23  1050  BP:  (!) 185/86    Pulse: (!) 124 (!) 115 (!) 107 (!) 103  Resp: 18 18 18 19   Temp: 99.4 F (37.4 C)     TempSrc: Oral     SpO2: 95% 96% 95% 96%  Weight:      Height:        Body mass index is 25.52 kg/m.  Physical Exam  General: Awake alert in no distress HEENT: Normocephalic atraumatic Lungs: Clear Cardiovascular: Regular rhythm Abdomen nondistended nontender Neurological exam She is awake alert oriented to self.  She is unable to tell me the correct age or month. She is having trouble with repeating long sentences.  She can name simple objects but has difficulty following multistep commands. Mild dysarthria Poor attention/concentration Cranial nerves II to XII: Pupils equal round react light, extraocular movement exam reveals somewhat of a right gaze preference, she is able to cross midline to look to the left but not all the way, blinks to threat from both sides and is able to count fingers on both sides, has no evidence of neglect face appears symmetric, tongue and palate midline. Motor examination with right upper extremity full strength with no drift, left upper extremity full strength with no drift, right lower extremity examination limited with pain-does not keep up for 5 seconds above the bed, left lower extremity full strength. Sensation intact to light touch Coordination examination with no gross dysmetria  Labs/Imaging/Neurodiagnostic studies   CBC:  Recent Labs  Lab November 12, 2023 1039  WBC 10.4  NEUTROABS 8.0*  HGB 12.3  HCT 36.6  MCV 93.4  PLT 232   Basic Metabolic Panel:  Lab Results  Component Value Date   NA 138 November 12, 2023   K 4.0 November 12, 2023   CO2 24 2023-11-12   GLUCOSE 166 (H) Nov 12, 2023   BUN 17 11/12/2023   CREATININE 1.05 (H) 11/12/23   CALCIUM 9.4 11/12/2023   GFRNONAA 52 (L) November 12, 2023   Alcohol Level     Component Value Date/Time   ETH <10 November 12, 2023 1039   INR  Lab Results  Component Value Date   INR 1.1 2023/11/12   APTT   Lab Results  Component Value Date   APTT 28 2023-11-12   Delay in radiology reading by neuro rads due to PACS downtime. Reviewed on the scanner with in-house on-call radiologist CT Head without contrast(Personally reviewed): Preliminary read: Aspects 10.  Chronic white matter changes.  No evidence of bleed.  Chronic left MCA infarct.  Later read by radiologist: Chronic to subacute left distal MCA infarct  CT angio Head and Neck with contrast(Personally reviewed): Preliminary read: Poor bolus timing.  No proximal ELVO  Later read by radiologist: Possible left M3 occlusion.  CT perfusion unremarkable.  CT perfusion study-no perfusion deficit to account for current symptoms.  Likely artifactual right frontal small area of penumbra without clear core.  ASSESSMENT   CRISTEL RAIL is a 88 y.o. female with above past medical history presenting for evaluation of difficulty with her speech.  On examination she does have some expressive and receptive aphasia.  She also has poor attention concentration.  CT head with possible subacute or chronic left MCA infarct (more chronic looking).  Toxic metabolic encephalopathy in the setting of an underlying infection more likely versus a new stroke cannot be ruled out and will need further imaging and testing.  Impression: Evaluate for stroke, evaluate for toxic metabolic encephalopathy  RECOMMENDATIONS  Check CBC, BMP and urinalysis Stat MRI of the brain Further recommendations based on testing Preliminary plan relayed to Dr. Vicenta Graft   Addendum MRI brain completed-no evidence of acute stroke. Check urinalysis. Can also check for other sources of infection such as bedsores that she has had. I would not pursue a stroke or TIA risk factor work up since this does not appear to localize to one hemisphere for me to be suspicious of stroke.  Final plan relayed to Dr.  Vicenta Graft. ______________________________________________________________________    Signed, Tona Francis, MD [facility]

## 2023-11-09 NOTE — Consult Note (Signed)
 CODE STROKE- PHARMACY COMMUNICATION   Time CODE STROKE called/page received: 1108  Time response to CODE STROKE was made (in person or via phone): 1111, in person  Time Stroke Kit retrieved from [facility] (only if needed): No thrombolytic, per neurologist  Name of Provider/Nurse contacted: Dr. Bonnita Buttner   Past Medical History:  Diagnosis Date   Arthritis    right knee, right hip   Hypertension    Vertigo    Wears dentures    full upper and lower   Prior to Admission medications   Medication Sig Start Date End Date Taking? Authorizing Provider  atenolol (TENORMIN) 50 MG tablet Take 50 mg by mouth daily.    [provider]  Cyanocobalamin (B-12 PO) Take 3,000 mg by mouth daily.    [provider]  dapagliflozin propanediol (FARXIGA) 5 MG TABS tablet Take 5 mg by mouth daily.    [provider]  doxazosin (CARDURA) 4 MG tablet Take 4 mg by mouth daily.    [provider]  etodolac (LODINE) 200 MG capsule Take 200 mg by mouth 2 (two) times daily.    [provider]  meclizine (ANTIVERT) 25 MG tablet Take 25 mg by mouth daily.    [provider]  naproxen (NAPROSYN) 500 MG tablet Take 500 mg by mouth 2 (two) times daily with a meal.    [provider]  predniSONE (DELTASONE) 2.5 MG tablet Take 2.5 mg by mouth daily with breakfast. Patient not taking: Reported on 02/05/2021    [provider]  simvastatin (ZOCOR) 20 MG tablet Take 20 mg by mouth daily.    [provider]  TIMOLOL MALEATE OP Apply to eye 2 (two) times daily.    [provider]  VITAMIN D PO Take by mouth daily.    [provider]    Will M. Alva Jewels, PharmD Clinical Pharmacist 11/09/2023 11:15 AM

## 2023-11-09 NOTE — ED Notes (Signed)
 Verbal order with readback per dr. Vicenta Graft MD to discontinue NIHSS assessments at this time.

## 2023-11-09 NOTE — Progress Notes (Signed)
   11/09/23 1200  Spiritual Encounters  Type of Visit Initial  Care provided to: Family  Referral source Code page (Stroke)  Reason for visit Code  OnCall Visit Yes  Interventions  Spiritual Care Interventions Made Established relationship of care and support;Compassionate presence;Reflective listening;Prayer;Encouragement  Intervention Outcomes  Outcomes Connection to spiritual care;Awareness around self/spiritual resourses;Awareness of support  Spiritual Care Plan  Spiritual Care Issues Still Outstanding No further spiritual care needs at this time (see row info)   Chaplain responded to code stroke and spent time with family providing compassion and prayer.  Patient taken to CT.

## 2023-11-09 NOTE — ED Provider Notes (Signed)
 Patient received in signout from Dr. Vicenta Graft pending urinalysis.  Patient presents with episode of confusion.  Presents as a stroke alert with a benign workup without signs of acute CVA has a reassuring workup without signs of metabolic pathology.  UA with small ketones suggestive of mild dehydration.  Will provide a small fluid bolus to address this and per original plan of care we will discharge for outpatient management.  Discussed close ED return precautions.   Arline Bennett, MD 11/09/23 [phone]

## 2023-12-10 ENCOUNTER — Observation Stay
Admission: EM | Admit: 2023-12-10 | Discharge: 2023-12-16 | Disposition: A | Discharge status: Skilled Nursing Facility | Attending: Internal Medicine | Admitting: Internal Medicine

## 2023-12-10 ENCOUNTER — Emergency Department

## 2023-12-10 ENCOUNTER — Other Ambulatory Visit: Payer: Self-pay

## 2023-12-10 ENCOUNTER — Encounter: Payer: Self-pay | Admitting: Intensive Care

## 2023-12-10 DIAGNOSIS — I340 Nonrheumatic mitral (valve) insufficiency: Secondary | ICD-10-CM | POA: Diagnosis not present

## 2023-12-10 DIAGNOSIS — N390 Urinary tract infection, site not specified: Secondary | ICD-10-CM | POA: Diagnosis not present

## 2023-12-10 DIAGNOSIS — R2689 Other abnormalities of gait and mobility: Secondary | ICD-10-CM | POA: Diagnosis not present

## 2023-12-10 DIAGNOSIS — M25562 Pain in left knee: Secondary | ICD-10-CM

## 2023-12-10 DIAGNOSIS — I5A Non-ischemic myocardial injury (non-traumatic): Secondary | ICD-10-CM | POA: Insufficient documentation

## 2023-12-10 DIAGNOSIS — W19XXXA Unspecified fall, initial encounter: Secondary | ICD-10-CM | POA: Diagnosis not present

## 2023-12-10 DIAGNOSIS — Z96643 Presence of artificial hip joint, bilateral: Secondary | ICD-10-CM | POA: Diagnosis not present

## 2023-12-10 DIAGNOSIS — R262 Difficulty in walking, not elsewhere classified: Secondary | ICD-10-CM

## 2023-12-10 DIAGNOSIS — D500 Iron deficiency anemia secondary to blood loss (chronic): Secondary | ICD-10-CM | POA: Diagnosis not present

## 2023-12-10 DIAGNOSIS — Z7982 Long term (current) use of aspirin: Secondary | ICD-10-CM | POA: Diagnosis not present

## 2023-12-10 DIAGNOSIS — E875 Hyperkalemia: Secondary | ICD-10-CM | POA: Diagnosis not present

## 2023-12-10 DIAGNOSIS — D649 Anemia, unspecified: Secondary | ICD-10-CM

## 2023-12-10 DIAGNOSIS — N1831 Chronic kidney disease, stage 3a: Secondary | ICD-10-CM | POA: Insufficient documentation

## 2023-12-10 DIAGNOSIS — I10 Essential (primary) hypertension: Secondary | ICD-10-CM

## 2023-12-10 DIAGNOSIS — L8990 Pressure ulcer of unspecified site, unspecified stage: Secondary | ICD-10-CM | POA: Diagnosis not present

## 2023-12-10 DIAGNOSIS — I129 Hypertensive chronic kidney disease with stage 1 through stage 4 chronic kidney disease, or unspecified chronic kidney disease: Secondary | ICD-10-CM | POA: Insufficient documentation

## 2023-12-10 DIAGNOSIS — M25561 Pain in right knee: Secondary | ICD-10-CM | POA: Diagnosis present

## 2023-12-10 DIAGNOSIS — R55 Syncope and collapse: Secondary | ICD-10-CM | POA: Diagnosis not present

## 2023-12-10 DIAGNOSIS — M170 Bilateral primary osteoarthritis of knee: Secondary | ICD-10-CM

## 2023-12-10 DIAGNOSIS — I350 Nonrheumatic aortic (valve) stenosis: Secondary | ICD-10-CM | POA: Insufficient documentation

## 2023-12-10 DIAGNOSIS — D509 Iron deficiency anemia, unspecified: Secondary | ICD-10-CM

## 2023-12-10 DIAGNOSIS — Y92009 Unspecified place in unspecified non-institutional (private) residence as the place of occurrence of the external cause: Secondary | ICD-10-CM

## 2023-12-10 DIAGNOSIS — M1611 Unilateral primary osteoarthritis, right hip: Secondary | ICD-10-CM

## 2023-12-10 DIAGNOSIS — R7989 Other specified abnormal findings of blood chemistry: Secondary | ICD-10-CM

## 2023-12-10 LAB — CBC WITH DIFFERENTIAL/PLATELET
Abs Immature Granulocytes: 0.06 10*3/uL (ref 0.00–0.07)
Basophils Absolute: 0 10*3/uL (ref 0.0–0.1)
Basophils Relative: 0 %
Eosinophils Absolute: 0.2 10*3/uL (ref 0.0–0.5)
Eosinophils Relative: 3 %
HCT: 34.4 % — ABNORMAL LOW (ref 36.0–46.0)
Hemoglobin: 11.1 g/dL — ABNORMAL LOW (ref 12.0–15.0)
Immature Granulocytes: 1 %
Lymphocytes Relative: 19 %
Lymphs Abs: 1.6 10*3/uL (ref 0.7–4.0)
MCH: 30.9 pg (ref 26.0–34.0)
MCHC: 32.3 g/dL (ref 30.0–36.0)
MCV: 95.8 fL (ref 80.0–100.0)
Monocytes Absolute: 1.1 10*3/uL — ABNORMAL HIGH (ref 0.1–1.0)
Monocytes Relative: 13 %
Neutro Abs: 5.5 10*3/uL (ref 1.7–7.7)
Neutrophils Relative %: 64 %
Platelets: 174 10*3/uL (ref 150–400)
RBC: 3.59 MIL/uL — ABNORMAL LOW (ref 3.87–5.11)
RDW: 12.8 % (ref 11.5–15.5)
WBC: 8.5 10*3/uL (ref 4.0–10.5)
nRBC: 0 % (ref 0.0–0.2)

## 2023-12-10 LAB — COMPREHENSIVE METABOLIC PANEL WITH GFR
ALT: 9 U/L (ref 0–44)
AST: 17 U/L (ref 15–41)
Albumin: 3 g/dL — ABNORMAL LOW (ref 3.5–5.0)
Alkaline Phosphatase: 57 U/L (ref 38–126)
Anion gap: 10 (ref 5–15)
BUN: 18 mg/dL (ref 8–23)
CO2: 25 mmol/L (ref 22–32)
Calcium: 8.9 mg/dL (ref 8.9–10.3)
Chloride: 107 mmol/L (ref 98–111)
Creatinine, Ser: 0.76 mg/dL (ref 0.44–1.00)
GFR, Estimated: >60 mL/min (ref >60)
Glucose, Bld: 108 mg/dL — ABNORMAL HIGH (ref 70–99)
Potassium: 3.9 mmol/L (ref 3.5–5.1)
Sodium: 142 mmol/L (ref 135–145)
Total Bilirubin: 0.9 mg/dL (ref 0.0–1.2)
Total Protein: 6.2 g/dL — ABNORMAL LOW (ref 6.5–8.1)

## 2023-12-10 LAB — TROPONIN I (HIGH SENSITIVITY)
Troponin I (High Sensitivity): 42 ng/L — ABNORMAL HIGH (ref <18)
Troponin I (High Sensitivity): 49 ng/L — ABNORMAL HIGH (ref <18)

## 2023-12-10 LAB — CK: Total CK: 57 U/L (ref 38–234)

## 2023-12-10 MED ORDER — SODIUM CHLORIDE 0.9 % IV BOLUS
1000.0000 mL | Freq: Once | INTRAVENOUS | Status: AC
  Administered 2023-12-10: 1000 mL via INTRAVENOUS

## 2023-12-10 MED ORDER — ACETAMINOPHEN 500 MG PO TABS
1000.0000 mg | ORAL_TABLET | Freq: Once | ORAL | Status: AC
  Administered 2023-12-10: 1000 mg via ORAL
  Filled 2023-12-10: qty 2

## 2023-12-10 NOTE — ED Notes (Signed)
 Patient refusing to try and ambulate. Patient states that's why she is here, because she tried to walk today with her walker and fell. Notified Dr. Vicenta Graft

## 2023-12-10 NOTE — ED Notes (Signed)
 Pt to XR

## 2023-12-10 NOTE — ED Provider Notes (Signed)
 [facility] Provider Note    Event Date/Time   First MD Initiated Contact with Patient 12/10/23 1836     (approximate)   History   Chief Complaint: Fall   HPI  Regina Ross is a 88 y.o. female with a history of hypertension, arthritis who comes the ED due to fall at home.  Patient reports that about 3 days ago she was standing at a dresser, not sure if she lost her balance or passed out but she fell to the floor.  She reports she has not been able to stand up and bear weight for the past few weeks and has been bedbound during this time.  Complains of some sores on her buttocks bilaterally as a result of prolonged laying in bed and pressure on the area.  Denies chest pain or shortness of breath or fever.  No dysuria.  Complains of pain in bilateral knees.        Past Medical History:  Diagnosis Date   Arthritis    right knee, right hip   Hypertension    Vertigo    Wears dentures    full upper and lower    Current Outpatient Rx   Order #: [id number] Class: Historical Med   Order #: [id number] Class: Historical Med   Order #: [id number] Class: Historical Med   Order #: [id number] Class: Historical Med   Order #: [id number] Class: Historical Med   Order #: [id number] Class: Historical Med   Order #: [id number] Class: Historical Med   Order #: [id number] Class: Historical Med   Order #: [id number] Class: Historical Med   Order #: [id number] Class: Historical Med   Order #: [id number] Class: Historical Med    Past Surgical History:  Procedure Laterality Date   CATARACT EXTRACTION W/PHACO Right 02/19/2021   Procedure: CATARACT EXTRACTION PHACO AND INTRAOCULAR LENS PLACEMENT (IOC) RIGHT HEALON 5 VISION BLUE 38.18 03:28.4;  Surgeon: Annell Kidney, MD;  Location: [facility];  Service: Ophthalmology;  Laterality: Right;   TOTAL HIP ARTHROPLASTY Left     Physical Exam   Triage Vital Signs: ED Triage Vitals [12/10/23 1550]  Encounter Vitals Group     BP (!)  172/80     Systolic BP Percentile      Diastolic BP Percentile      Pulse Rate 96     Resp 18     Temp 98.5 F (36.9 C)     Temp Source Oral     SpO2 97 %     Weight 170 lb (77.1 kg)     Height 5\' 7"  (1.702 m)     Head Circumference      Peak Flow      Pain Score 10     Pain Loc      Pain Education      Exclude from Growth Chart     Most recent vital signs: Vitals:   12/10/23 2200 12/10/23 2228  BP: (!) 174/61   Pulse: 68 70  Resp: 20 16  Temp:    SpO2: 96% 97%    General: Awake, no distress.  CV:  Good peripheral perfusion.  Regular rate rhythm Resp:  Normal effort.  Clear to auscultation bilaterally Abd:  No distention.  Soft nontender Other:  Dry oral mucosa.  There is tenderness over the left lateral malleolus, left proximal tibia, right knee distal femur, and pain in the right hip with passive range of motion.  No signs of head trauma.  No midline spinal tenderness.  Lower extremity limb length  is symmetric.  No abnormal rotation.  With passive range of motion of both legs, she reports pain in the knees but otherwise tolerates the movement well.  Cranial nerves III through XII intact, no dysarthria.  No drift in bilateral upper extremities.  Pain limits ability to engage with motor testing bilateral lower extremities.   ED Results / Procedures / Treatments   Labs (all labs ordered are listed, but only abnormal results are displayed) Labs Reviewed  COMPREHENSIVE METABOLIC PANEL WITH GFR - Abnormal; Notable for the following components:      Result Value   Glucose, Bld 108 (*)    Total Protein 6.2 (*)    Albumin 3.0 (*)    All other components within normal limits  CBC WITH DIFFERENTIAL/PLATELET - Abnormal; Notable for the following components:   RBC 3.59 (*)    Hemoglobin 11.1 (*)    HCT 34.4 (*)    Monocytes Absolute 1.1 (*)    All other components within normal limits  TROPONIN I (HIGH SENSITIVITY) - Abnormal; Notable for the following components:    Troponin I (High Sensitivity) 42 (*)    All other components within normal limits  TROPONIN I (HIGH SENSITIVITY) - Abnormal; Notable for the following components:   Troponin I (High Sensitivity) 49 (*)    All other components within normal limits  CK  URINALYSIS, W/ REFLEX TO CULTURE (INFECTION SUSPECTED)     EKG Interpreted by me Sinus rhythm rate of 83.  Normal axis, normal intervals.  Poor R wave progression.  No acute ischemic changes.   RADIOLOGY X-ray right hip and pelvis interpreted by me, negative for fracture or dislocation.  Radiology report reviewed  CT head negative  X-ray bilateral knees shows advanced osteoarthrosis, no fracture X-ray left ankle negative for fracture   PROCEDURES:  Procedures   MEDICATIONS ORDERED IN ED: Medications  acetaminophen (TYLENOL) tablet 1,000 mg (1,000 mg Oral Given 12/10/23 1856)  sodium chloride 0.9 % bolus 1,000 mL (1,000 mLs Intravenous New Bag/Given 12/10/23 1950)  sodium chloride 0.9 % bolus 1,000 mL (0 mLs Intravenous Stopped 12/10/23 2210)     IMPRESSION / MDM / ASSESSMENT AND PLAN / ED COURSE  I reviewed the triage vital signs and the nursing notes.  DDx: Intracranial hemorrhage, right hip or pelvis fracture, right knee fracture, left knee fracture, left ankle fracture, dehydration, AKI, rhabdomyolysis, electrolyte derangement, anemia  Patient's presentation is most consistent with acute presentation with potential threat to life or bodily function.  Patient presents with fall at home, suspected syncope.  She and her family member at bedside further clarified that she has not been able to get out of bed for the past 2 weeks.  Will check labs, hydrate with IV fluids, obtain x-rays and CT head for trauma evaluation   ----------------------------------------- 10:49 PM on 12/10/2023 ----------------------------------------- Initial workup unremarkable.  Serum labs unremarkable.  Patient has not provided urine yet.  Tried a  trial of ambulation, but patient refuses to get out of bed due to severe pain with weightbearing in the knees.  Will obtain CT to look for occult fracture.      FINAL CLINICAL IMPRESSION(S) / ED DIAGNOSES   Final diagnoses:  Fall in home, initial encounter  Acute pain of both knees     Rx / DC Orders   ED Discharge Orders     None        Note:  This document was prepared using Dragon voice recognition software and may include unintentional dictation  errors.   Jacquie Maudlin, MD 12/10/23 2250

## 2023-12-10 NOTE — ED Triage Notes (Addendum)
 Brought in by EMS from home. Patient reports mechanical fall 3 days ago. Baseline uses walker but since fall has been non weight bearing.   C/o right knee and thigh pain. Reports left ankle and upper arm pain. Patient states she has breakdown on buttocks since fall three days ago from being immobile   Denies loc. Denies taking blood thinners.

## 2023-12-11 ENCOUNTER — Observation Stay
Admit: 2023-12-11 | Discharge: 2023-12-11 | Disposition: A | Discharge status: Home / Self Care | Attending: Internal Medicine | Admitting: Internal Medicine

## 2023-12-11 DIAGNOSIS — D649 Anemia, unspecified: Secondary | ICD-10-CM

## 2023-12-11 DIAGNOSIS — I340 Nonrheumatic mitral (valve) insufficiency: Secondary | ICD-10-CM

## 2023-12-11 DIAGNOSIS — R55 Syncope and collapse: Secondary | ICD-10-CM

## 2023-12-11 DIAGNOSIS — L8990 Pressure ulcer of unspecified site, unspecified stage: Secondary | ICD-10-CM

## 2023-12-11 DIAGNOSIS — W19XXXA Unspecified fall, initial encounter: Secondary | ICD-10-CM

## 2023-12-11 DIAGNOSIS — I10 Essential (primary) hypertension: Secondary | ICD-10-CM

## 2023-12-11 DIAGNOSIS — N3000 Acute cystitis without hematuria: Secondary | ICD-10-CM | POA: Diagnosis not present

## 2023-12-11 DIAGNOSIS — R7989 Other specified abnormal findings of blood chemistry: Secondary | ICD-10-CM

## 2023-12-11 DIAGNOSIS — M25562 Pain in left knee: Secondary | ICD-10-CM

## 2023-12-11 DIAGNOSIS — Y92009 Unspecified place in unspecified non-institutional (private) residence as the place of occurrence of the external cause: Secondary | ICD-10-CM

## 2023-12-11 DIAGNOSIS — M25561 Pain in right knee: Principal | ICD-10-CM

## 2023-12-11 DIAGNOSIS — I350 Nonrheumatic aortic (valve) stenosis: Secondary | ICD-10-CM

## 2023-12-11 DIAGNOSIS — N390 Urinary tract infection, site not specified: Secondary | ICD-10-CM

## 2023-12-11 DIAGNOSIS — R262 Difficulty in walking, not elsewhere classified: Secondary | ICD-10-CM

## 2023-12-11 LAB — ECHOCARDIOGRAM COMPLETE
AR max vel: 0.84 cm2
AV Area VTI: 0.78 cm2
AV Area mean vel: 0.78 cm2
AV Mean grad: 55.8 mmHg
AV Peak grad: 92.4 mmHg
Ao pk vel: 4.81 m/s
Area-P 1/2: 2.91 cm2
Height: 67 in
P 1/2 time: 414 ms
S' Lateral: 2.9 cm
Weight: 2836 [oz_av]

## 2023-12-11 LAB — IRON AND TIBC
Iron: 41 ug/dL (ref 28–170)
Saturation Ratios: 20 % (ref 10.4–31.8)
TIBC: 204 ug/dL — ABNORMAL LOW (ref 250–450)
UIBC: 163 ug/dL

## 2023-12-11 LAB — RETICULOCYTES
Immature Retic Fract: 16.3 % — ABNORMAL HIGH (ref 2.3–15.9)
RBC.: 3.36 MIL/uL — ABNORMAL LOW (ref 3.87–5.11)
Retic Count, Absolute: 53.4 10*3/uL (ref 19.0–186.0)
Retic Ct Pct: 1.6 % (ref 0.4–3.1)

## 2023-12-11 LAB — FERRITIN: Ferritin: 73 ng/mL (ref 11–307)

## 2023-12-11 LAB — VITAMIN B12: Vitamin B-12: 319 pg/mL (ref 180–914)

## 2023-12-11 LAB — MAGNESIUM: Magnesium: 1.9 mg/dL (ref 1.7–2.4)

## 2023-12-11 LAB — URINALYSIS, W/ REFLEX TO CULTURE (INFECTION SUSPECTED)
Bilirubin Urine: NEGATIVE
Glucose, UA: NEGATIVE mg/dL
Hgb urine dipstick: NEGATIVE
Ketones, ur: NEGATIVE mg/dL
Nitrite: NEGATIVE
Protein, ur: NEGATIVE mg/dL
Specific Gravity, Urine: 1.013 (ref 1.005–1.030)
pH: 5 (ref 5.0–8.0)

## 2023-12-11 LAB — TSH: TSH: 0.385 u[IU]/mL (ref 0.350–4.500)

## 2023-12-11 LAB — GLUCOSE, CAPILLARY: Glucose-Capillary: 105 mg/dL — ABNORMAL HIGH (ref 70–99)

## 2023-12-11 LAB — FOLATE: Folate: 8.1 ng/mL (ref >5.9)

## 2023-12-11 MED ORDER — HYDRALAZINE HCL 20 MG/ML IJ SOLN
5.0000 mg | INTRAMUSCULAR | Status: DC | PRN
  Administered 2023-12-11: 5 mg via INTRAVENOUS
  Filled 2023-12-11: qty 1

## 2023-12-11 MED ORDER — SIMVASTATIN 20 MG PO TABS
20.0000 mg | ORAL_TABLET | Freq: Every day | ORAL | Status: DC
  Administered 2023-12-11 – 2023-12-15 (×5): 20 mg via ORAL
  Filled 2023-12-11 (×5): qty 1

## 2023-12-11 MED ORDER — ACETAMINOPHEN 650 MG RE SUPP: 650.0000 mg | Freq: Four times a day (QID) | RECTAL | Status: DC | PRN

## 2023-12-11 MED ORDER — POLYVINYL ALCOHOL 1.4 % OP SOLN: 1.0000 [drp] | OPHTHALMIC | Status: DC | PRN

## 2023-12-11 MED ORDER — OXYCODONE HCL 5 MG PO TABS
5.0000 mg | ORAL_TABLET | ORAL | Status: DC | PRN
  Administered 2023-12-11 – 2023-12-16 (×13): 5 mg via ORAL
  Filled 2023-12-11 (×13): qty 1

## 2023-12-11 MED ORDER — SODIUM CHLORIDE 0.9 % IV SOLN: 1.0000 g | Freq: Once | INTRAVENOUS | Status: DC

## 2023-12-11 MED ORDER — ENOXAPARIN SODIUM 40 MG/0.4ML IJ SOSY
40.0000 mg | PREFILLED_SYRINGE | INTRAMUSCULAR | Status: DC
  Administered 2023-12-11 – 2023-12-16 (×6): 40 mg via SUBCUTANEOUS
  Filled 2023-12-11 (×6): qty 0.4

## 2023-12-11 MED ORDER — ACETAMINOPHEN 325 MG PO TABS
650.0000 mg | ORAL_TABLET | Freq: Four times a day (QID) | ORAL | Status: DC | PRN
  Administered 2023-12-11 – 2023-12-12 (×2): 650 mg via ORAL
  Filled 2023-12-11 (×2): qty 2

## 2023-12-11 MED ORDER — ONDANSETRON HCL 4 MG PO TABS: 4.0000 mg | ORAL_TABLET | Freq: Four times a day (QID) | ORAL | Status: DC | PRN

## 2023-12-11 MED ORDER — ONDANSETRON HCL 4 MG/2ML IJ SOLN: 4.0000 mg | Freq: Four times a day (QID) | INTRAMUSCULAR | Status: DC | PRN

## 2023-12-11 MED ORDER — DAPAGLIFLOZIN PROPANEDIOL 5 MG PO TABS
5.0000 mg | ORAL_TABLET | Freq: Every day | ORAL | Status: DC
  Administered 2023-12-11: 5 mg via ORAL
  Filled 2023-12-11: qty 1

## 2023-12-11 MED ORDER — SODIUM CHLORIDE 0.9% FLUSH
3.0000 mL | Freq: Two times a day (BID) | INTRAVENOUS | Status: DC
  Administered 2023-12-11 – 2023-12-16 (×11): 3 mL via INTRAVENOUS

## 2023-12-11 MED ORDER — MECLIZINE HCL 25 MG PO TABS
25.0000 mg | ORAL_TABLET | Freq: Every day | ORAL | Status: DC
Start: 2023-12-11 — End: 2023-12-16
  Administered 2023-12-11 – 2023-12-16 (×6): 25 mg via ORAL
  Filled 2023-12-11 (×7): qty 1

## 2023-12-11 MED ORDER — ASPIRIN 81 MG PO TBEC
81.0000 mg | DELAYED_RELEASE_TABLET | Freq: Every day | ORAL | Status: DC
  Administered 2023-12-11 – 2023-12-16 (×6): 81 mg via ORAL
  Filled 2023-12-11 (×6): qty 1

## 2023-12-11 MED ORDER — SODIUM CHLORIDE 0.9 % IV SOLN
2.0000 g | Freq: Once | INTRAVENOUS | Status: AC
  Administered 2023-12-11: 2 g via INTRAVENOUS
  Filled 2023-12-11: qty 20

## 2023-12-11 MED ORDER — DOXAZOSIN MESYLATE 4 MG PO TABS
4.0000 mg | ORAL_TABLET | Freq: Every day | ORAL | Status: DC
  Administered 2023-12-11 – 2023-12-16 (×6): 4 mg via ORAL
  Filled 2023-12-11 (×6): qty 1

## 2023-12-11 MED ORDER — CEPHALEXIN 500 MG PO CAPS
500.0000 mg | ORAL_CAPSULE | Freq: Three times a day (TID) | ORAL | Status: AC
  Administered 2023-12-11 – 2023-12-15 (×12): 500 mg via ORAL
  Filled 2023-12-11 (×12): qty 1

## 2023-12-11 MED ORDER — ATENOLOL 25 MG PO TABS
50.0000 mg | ORAL_TABLET | Freq: Every day | ORAL | Status: DC
  Administered 2023-12-11 – 2023-12-16 (×6): 50 mg via ORAL
  Filled 2023-12-11 (×6): qty 2

## 2023-12-11 NOTE — Assessment & Plan Note (Deleted)
 Hemoglobin 11.7 Will get anemia panel

## 2023-12-11 NOTE — Assessment & Plan Note (Addendum)
 Patient on atenolol  and doxazosin .

## 2023-12-11 NOTE — Progress Notes (Signed)
 [facility]  - [place] [facility]   PATIENT NAME: Regina Ross    MR#:  [id number]  DATE OF BIRTH:  04/22/1936  SUBJECTIVE:  son at bedside. Patient has lately not been able to ambulate for last 1 to 2 weeks. She is much bedbound at home health wise down. Patient is complaining of right knee pains and swelling. Complained of like giving out and falling. Supposed to see orthopedic as outpatient cannot remember which practice.  Denies any chest pain lightheadedness or dizziness.    VITALS:  Blood pressure 129/61, pulse 67, temperature 98.3 F (36.8 C), resp. rate 16, height 5\' 7"  (1.702 m), weight 80.4 kg, SpO2 98%.  PHYSICAL EXAMINATION:   GENERAL:  88 y.o.-year-old patient with no acute distress. Morbidly obese LUNGS: Normal breath sounds bilaterally, no wheezing CARDIOVASCULAR: S1, S2 normal. No murmur   ABDOMEN: Soft, nontender, nondistended. Bowel sounds present.  EXTREMITIES: right knee tenderness. Decreased range of motion.    NEUROLOGIC: nonfocal  patient is alert and awake SKIN: superficial skin breakdown on the buttock.  LABORATORY PANEL:  CBC Recent Labs  Lab 12/10/23 1939  WBC 8.5  HGB 11.1*  HCT 34.4*  PLT 174    Chemistries  Recent Labs  Lab 12/10/23 1939 12/10/23 2114  NA 142  --   K 3.9  --   CL 107  --   CO2 25  --   GLUCOSE 108*  --   BUN 18  --   CREATININE 0.76  --   CALCIUM 8.9  --   MG  --  1.9  AST 17  --   ALT 9  --   ALKPHOS 57  --   BILITOT 0.9  --    Cardiac Enzymes No results for input(s): "TROPONINI" in the last 168 hours. RADIOLOGY:  ECHOCARDIOGRAM COMPLETE Result Date: 12/11/2023    ECHOCARDIOGRAM REPORT   Patient Name:   Regina Ross Date of Exam: 12/11/2023 Medical Rec #:  [id number]     Height:       67.0 in Accession #:    [id number]    Weight:       177.2 lb Date of Birth:  11-Apr-1936     BSA:          1.921 m Patient Age:    87 years      BP:           119/63 mmHg Patient Gender: F             HR:            75 bpm. Exam Location:  [facility] Procedure: 2D Echo, Cardiac Doppler and Color Doppler (Both Spectral and Color            Flow Doppler were utilized during procedure). Indications:     Syncope R55  History:         Patient has no prior history of Echocardiogram examinations.  Sonographer:     Clenton Czech RDCS, FASE Referring Phys:  [id number] Lanetta Pion Diagnosing Phys: Lanell Pinta Custovic IMPRESSIONS  1. Left ventricular ejection fraction, by estimation, is 60 to 65%. The left ventricle has normal function. The left ventricle has no regional wall motion abnormalities. Left ventricular diastolic parameters are consistent with Grade I diastolic dysfunction (impaired relaxation).  2. Right ventricular systolic function is normal. The right ventricular size is normal. There is normal pulmonary artery systolic pressure. The estimated right ventricular systolic pressure is 33.9 mmHg.  3. The  mitral valve is normal in structure. Trivial mitral valve regurgitation.  4. The aortic valve is calcified. Aortic valve regurgitation is mild to moderate. Severe aortic valve stenosis. Aortic regurgitation PHT measures 414 msec. Aortic valve area, by VTI measures 0.78 cm. Aortic valve mean gradient measures 55.8 mmHg. Aortic valve Vmax measures 4.81 m/s.  5. The inferior vena cava is normal in size with greater than 50% respiratory variability, suggesting right atrial pressure of 3 mmHg. FINDINGS  Left Ventricle: Left ventricular ejection fraction, by estimation, is 60 to 65%. The left ventricle has normal function. The left ventricle has no regional wall motion abnormalities. The left ventricular internal cavity size was normal in size. There is  no left ventricular hypertrophy. Left ventricular diastolic parameters are consistent with Grade I diastolic dysfunction (impaired relaxation). Right Ventricle: The right ventricular size is normal. No increase in right ventricular wall thickness. Right ventricular systolic function is  normal. There is normal pulmonary artery systolic pressure. The tricuspid regurgitant velocity is 2.78 m/s, and  with an assumed right atrial pressure of 3 mmHg, the estimated right ventricular systolic pressure is 33.9 mmHg. Left Atrium: Left atrial size was normal in size. Right Atrium: Right atrial size was normal in size. Pericardium: There is no evidence of pericardial effusion. Mitral Valve: The mitral valve is normal in structure. Trivial mitral valve regurgitation. Tricuspid Valve: The tricuspid valve is normal in structure. Tricuspid valve regurgitation is mild. Aortic Valve: The aortic valve is calcified. Aortic valve regurgitation is mild to moderate. Aortic regurgitation PHT measures 414 msec. Severe aortic stenosis is present. Aortic valve mean gradient measures 55.8 mmHg. Aortic valve peak gradient measures  92.4 mmHg. Aortic valve area, by VTI measures 0.78 cm. Pulmonic Valve: The pulmonic valve was normal in structure. Pulmonic valve regurgitation is not visualized. No evidence of pulmonic stenosis. Aorta: The aortic root is normal in size and structure. Venous: The inferior vena cava is normal in size with greater than 50% respiratory variability, suggesting right atrial pressure of 3 mmHg. IAS/Shunts: No atrial level shunt detected by color flow Doppler.  LEFT VENTRICLE PLAX 2D LVIDd:         4.20 cm   Diastology LVIDs:         2.90 cm   LV e' medial:    4.79 cm/s LV PW:         1.30 cm   LV E/e' medial:  10.9 LV IVS:        1.20 cm   LV e' lateral:   3.59 cm/s LVOT diam:     2.00 cm   LV E/e' lateral: 14.6 LV SV:         90 LV SV Index:   47 LVOT Area:     3.14 cm  RIGHT VENTRICLE RV Basal diam:  3.30 cm RV S prime:     14.00 cm/s TAPSE (M-mode): 2.2 cm LEFT ATRIUM           Index        RIGHT ATRIUM           Index LA diam:      3.40 cm 1.77 cm/m   RA Area:     10.60 cm LA Vol (A4C): 46.4 ml 24.15 ml/m  RA Volume:   24.90 ml  12.96 ml/m  AORTIC VALVE AV Area (Vmax):    0.84 cm AV Area  (Vmean):   0.78 cm AV Area (VTI):     0.78 cm AV Vmax:  480.75 cm/s AV Vmean:          351.500 cm/s AV VTI:            1.160 m AV Peak Grad:      92.4 mmHg AV Mean Grad:      55.8 mmHg LVOT Vmax:         128.00 cm/s LVOT Vmean:        87.400 cm/s LVOT VTI:          0.288 m LVOT/AV VTI ratio: 0.25 AI PHT:            414 msec  AORTA Ao Root diam: 2.60 cm Ao Asc diam:  2.90 cm MITRAL VALVE                TRICUSPID VALVE MV Area (PHT): 2.91 cm     TR Peak grad:   30.9 mmHg MV Decel Time: 261 msec     TR Vmax:        278.00 cm/s MV E velocity: 52.40 cm/s MV A velocity: 125.00 cm/s  SHUNTS MV E/A ratio:  0.42         Systemic VTI:  0.29 m                             Systemic Diam: 2.00 cm Lanell Pinta Custovic Electronically signed by Isabell Manzanilla Signature Date/Time: 12/11/2023/2:28:32 PM    Final    CT KNEE LEFT WO CONTRAST Result Date: 12/10/2023 CLINICAL DATA:  Trauma EXAM: CT OF THE LEFT KNEE WITHOUT CONTRAST TECHNIQUE: Multidetector CT imaging of the left knee was performed according to the standard protocol. Multiplanar CT image reconstructions were also generated. RADIATION DOSE REDUCTION: This exam was performed according to the departmental dose-optimization program which includes automated exposure control, adjustment of the mA and/or kV according to patient size and/or use of iterative reconstruction technique. COMPARISON:  Left knee x-ray same day FINDINGS: Bones/Joint/Cartilage The bones are osteopenic. No focal osseous lesions are seen. There is no acute fracture or dislocation. There is tricompartmental joint space narrowing and osteophyte formation. This is most significant/severe in the lateral compartment. Moderate joint effusion present. Ligaments Suboptimally assessed by CT. Muscles and Tendons No intramuscular hematoma identified. There is edema and stranding in the region of the medial collateral ligament and medial patellar retinaculum. Soft tissues Peripheral vascular calcifications are  present. IMPRESSION: 1. No acute fracture or dislocation. 2. Moderate joint effusion. 3. Edema and stranding in the region of the medial collateral ligament and medial patellar retinaculum. Correlate clinically for injury. 4. Tricompartmental degenerative changes, most significant in the lateral compartment. Electronically Signed   By: Tyron Gallon M.D.   On: 12/10/2023 23:29   CT Knee Right Wo Contrast Result Date: 12/10/2023 CLINICAL DATA:  Knee injury, occult fracture suspected EXAM: CT OF THE RIGHT KNEE WITHOUT CONTRAST TECHNIQUE: Multidetector CT imaging of the right knee was performed according to the standard protocol. Multiplanar CT image reconstructions were also generated. RADIATION DOSE REDUCTION: This exam was performed according to the departmental dose-optimization program which includes automated exposure control, adjustment of the mA and/or kV according to patient size and/or use of iterative reconstruction technique. COMPARISON:  Plain films today FINDINGS: Bones/Joint/Cartilage Advanced tricompartment osteoarthritis in the right knee with severe joint space narrowing and spurring. Small so stated joint effusion. No fracture visualized. No subluxation or dislocation. Ligaments Suboptimally assessed by CT. Muscles and Tendons Negative Soft tissues Negative IMPRESSION: No acute bony abnormality.  Severe osteoarthritis with small joint effusion. Electronically Signed   By: Janeece Mechanic M.D.   On: 12/10/2023 23:27   DG HIP UNILAT WITH PELVIS 2-3 VIEWS RIGHT Result Date: 12/10/2023 CLINICAL DATA:  Fall EXAM: DG HIP (WITH OR WITHOUT PELVIS) 2-3V RIGHT COMPARISON:  None Available. FINDINGS: Prior left hip replacement. Advanced degenerative changes in the right hip. No acute bony abnormality. Specifically, no fracture, subluxation, or dislocation. IMPRESSION: No acute bony abnormality. Electronically Signed   By: Janeece Mechanic M.D.   On: 12/10/2023 19:30   DG Knee Complete 4 Views Left Result Date:  12/10/2023 CLINICAL DATA:  Left knee pain, swelling EXAM: LEFT KNEE - COMPLETE 4+ VIEW COMPARISON:  None Available. FINDINGS: Advanced tricompartment degenerative changes. No significant joint effusion. No acute bony abnormality. Specifically, no fracture, subluxation, or dislocation. IMPRESSION: Advanced degenerative changes.  No acute bony abnormality. Electronically Signed   By: Janeece Mechanic M.D.   On: 12/10/2023 19:29   CT Head Wo Contrast Result Date: 12/10/2023 CLINICAL DATA:  Mental status change, unknown cause.  Fall. EXAM: CT HEAD WITHOUT CONTRAST TECHNIQUE: Contiguous axial images were obtained from the base of the skull through the vertex without intravenous contrast. RADIATION DOSE REDUCTION: This exam was performed according to the departmental dose-optimization program which includes automated exposure control, adjustment of the mA and/or kV according to patient size and/or use of iterative reconstruction technique. COMPARISON:  11/09/2023 FINDINGS: Brain: There is atrophy and chronic small vessel disease changes. 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 No acute intracranial abnormality. Specifically, no hemorrhage, hydrocephalus, mass lesion, acute infarction, or significant intracranial injury. Vascular: No hyperdense vessel or unexpected calcification. Skull: No acute calvarial abnormality. Sinuses/Orbits: No acute findings Other: None IMPRESSION: Atrophy, chronic microvascular disease. No acute intracranial abnormality. Electronically Signed   By: Janeece Mechanic M.D.   On: 12/10/2023 19:29   DG Knee Complete 4 Views Right Result Date: 12/10/2023 CLINICAL DATA:  FALL EXAM: RIGHT KNEE - COMPLETE 4+ VIEW COMPARISON:  None Available. FINDINGS: THERE IS A SMALL JOINT EFFUSION. THERE IS NO ACUTE FRACTURE OR DISLOCATION IDENTIFIED. THERE IS SEVERE TRICOMPARTMENTAL OSTEOARTHROSIS WITH JOINT SPACE NARROWING AND OSTEOPHYTE FORMATION. IMPRESSION: 1. No acute fracture or  dislocation. 2. Small joint effusion. 3. Severe tricompartmental osteoarthrosis. Electronically Signed   By: Tyron Gallon M.D.   On: 12/10/2023 16:59   DG Ankle Complete Left Result Date: 12/10/2023 CLINICAL DATA:  Fall and left ankle pain. EXAM: LEFT ANKLE COMPLETE - 3+ VIEW COMPARISON:  None Available. FINDINGS: There is no acute fracture or dislocation. The bones are osteopenic. The ankle mortise is intact. The soft tissues are unremarkable. IMPRESSION: 1. No acute fracture or dislocation. 2. Osteopenia. Electronically Signed   By: Angus Bark M.D.   On: 12/10/2023 16:54    Assessment and Plan TEDDY PENA is a 88 y.o. female with medical history significant for severe mitral regurgitation, aortic valve stenosis, HTN, HLD, frequent falls, OA bilateral knees, CKD llla, who presents to the ED for evaluation of a difficulty ambulating since a fall a week prior.  At baseline she ambulates with a walker and states that a week ago she was trying to put something in a drawer and lost her balance and fell.  Since then she has been essentially bedbound and unable to walk and now is having soreness on her buttocks that is prompting a decision to come to the emergency room to get checked out.   CT knee right: No acute fracture or dislocation.  2. Moderate joint effusion. 3. Edema and stranding in the region of the medial collateral ligament and medial patellar retinaculum. Correlate clinically for injury. 4. Tricompartmental degenerative changes, most significant in the lateral compartment.  Aortic valve stenosis Chronic History of severe mitral regurgitation and aortic valve stenosis Fall, low suspicion for syncope--pt DID not pass out--her legs gave out --Head CT nonacute --Received IV fluid boluses in the ED --Echo shws severe AS, mild to mod AR  Right knee pain/Swelling with h/o falls --CT knee right knee effusion --Ortho consult with Dr Clyda Dark   Urinary tract infection --Urinalysis  consistent with infection --IV Rocephin--change to Keflex   Elevated troponin --Troponin 47 but EKG nonacute and patient denies chest pain --Suspect demand ischemia   Ambulatory dysfunction History of a fall History of osteoarthritis --PT OT consulted   Decubitus ulcers, superficial --frequent turns --Foam padding   Anemia   HTN (hypertension) BP somewhat elevated Will resume atenolol and doxazosin  Hydralazine as needed for now   Procedures: Family communication :son in the room Consults :Ortho CODE STATUS: Full DVT Prophylaxis :lovenox Level of care: Telemetry Medical  TOC for d/c planning   TOTAL TIME TAKING CARE OF THIS PATIENT: 40 minutes.  >50% time spent on counselling and coordination of care  Note: This dictation was prepared with Dragon dictation along with smaller phrase technology. Any transcriptional errors that result from this process are unintentional.  Melvinia Stager M.D    [facility]   CC: Primary care physician; Sharyne Degree, FNP

## 2023-12-11 NOTE — Assessment & Plan Note (Addendum)
 History of a fall History of osteoarthritis Physical therapy recommending rehab

## 2023-12-11 NOTE — Progress Notes (Signed)
 Occupational Therapy Evaluation Patient Details Name: Regina Ross MRN: [id number] DOB: 14-Jun-1936 Today's Date: 12/11/2023   History of Present Illness   Regina Ross is a 88 y.o. female with a history of hypertension, arthritis who comes the ED due to fall at home.     Clinical Impressions Pt was seen for OT evaluation this date. Pt was alert and oriented x4. Prior to hospital admission, within the last two weeks pt has been bed bound due post fall within the home. Pt reports before her fall she would amb very short distances with her walker. Her Nephew lives with her and assist with all her ADLs/IADLs. Pt presents to acute OT demonstrating impaired ADL performance, activity tolerance and functional mobility (See OT problem list for additional functional deficits). Pt currently requires set up assistance to complete bed level grooming tasks, MAXA for donning bilateral socks. Pt attempted x2 to get to the EOB with MAXA but unable to continue due to intense pain in bilateral knees and buttocks. Pt returns to supine and rolled towards the L with MODA + cues for hand placement to assist in rolling. Pt endorses she is very weak from not moving much recently but is hopeful for the future. Pt educated on the need for frequent position changes and bed sore prevention. Pt would benefit from skilled OT services to address noted impairments and functional limitations (see below for any additional details) in order to maximize safety and independence while minimizing falls risk and caregiver burden. OT will follow acutely.     If plan is discharge home, recommend the following:   Two people to help with walking and/or transfers;A lot of help with bathing/dressing/bathroom;Two people to help with bathing/dressing/bathroom;Assistance with cooking/housework;Assist for transportation;Help with stairs or ramp for entrance     Functional Status Assessment   Patient has had a recent decline in their  functional status and demonstrates the ability to make significant improvements in function in a reasonable and predictable amount of time.     Equipment Recommendations   Other (comment) (Defer next venue of care)     Recommendations for Other Services         Precautions/Restrictions   Precautions Precautions: Fall Recall of Precautions/Restrictions: Intact Restrictions Weight Bearing Restrictions Per Provider Order: Yes Other Position/Activity Restrictions: WBAT     Mobility Bed Mobility Overal bed mobility: Needs Assistance Bed Mobility: Rolling Rolling: Mod assist, Used rails         General bed mobility comments: Attempted getting to EOB unable due to level of pain in bilateral knees and buttocks pain    Transfers                   General transfer comment: NT will re-attempt      Balance                                           ADL either performed or assessed with clinical judgement   ADL Overall ADL's : Needs assistance/impaired Eating/Feeding: Set up;Sitting   Grooming: Wash/dry face;Wash/dry hands;Sitting;Set up                                 General ADL Comments: Anticipate MAXA for all ADL tasks      Pertinent Vitals/Pain Pain Assessment Pain Assessment: Faces (No pain  reported at rest, 10/10 pain when attempted mobility) Faces Pain Scale: Hurts worst Pain Descriptors / Indicators: Crying Pain Intervention(s): Repositioned, Limited activity within patient's tolerance     Extremity/Trunk Assessment Upper Extremity Assessment Upper Extremity Assessment: Overall WFL for tasks assessed;Generalized weakness   Lower Extremity Assessment Lower Extremity Assessment: Defer to PT evaluation;Generalized weakness;LLE deficits/detail;RLE deficits/detail RLE: Unable to fully assess due to pain LLE: Unable to fully assess due to pain       Communication Communication Communication: No apparent  difficulties   Cognition Arousal: Alert Behavior During Therapy: WFL for tasks assessed/performed Cognition: No apparent impairments             OT - Cognition Comments: A/Ox4                 Following commands: Intact       Cueing  General Comments   Cueing Techniques: Verbal cues;Tactile cues  Pt repositioned to off load pressure on buttocks   Exercises Exercises: Other exercises Other Exercises Other Exercises: Edu: Role of OT eval, benefits for attempting OOB mobility, hand placement rolling   Shoulder Instructions      Home Living Family/patient expects to be discharged to:: Private residence Living Arrangements: Other relatives Available Help at Discharge: Family (Nephew) Type of Home: House Home Access: Level entry     Home Layout: One level     Bathroom Shower/Tub: Tub/shower unit;Walk-in shower   Bathroom Toilet: Standard (Uses BSC only)     Home Equipment: BSC/3in1;Rolling Walker (2 wheels);Wheelchair - manual;Grab bars - tub/shower;Hospital bed   Additional Comments: Recently got hospital bed      Prior Functioning/Environment Prior Level of Function : Needs assist;History of Falls (last six months)             Mobility Comments: Using walker to pivot for all transfers, no very ambulatory at baseline ADLs Comments: Nephew assist with all ADL/IADL    OT Problem List: Decreased strength;Decreased activity tolerance;Decreased range of motion;Impaired balance (sitting and/or standing);Decreased coordination;Decreased safety awareness;Decreased knowledge of use of DME or AE;Decreased knowledge of precautions   OT Treatment/Interventions: Self-care/ADL training;Therapeutic exercise;Energy conservation;DME and/or AE instruction;Therapeutic activities;Balance training;Patient/family education;Visual/perceptual remediation/compensation      OT Goals(Current goals can be found in the care plan section)   Acute Rehab OT Goals Patient  Stated Goal: get better OT Goal Formulation: With patient Time For Goal Achievement: 12/25/23 Potential to Achieve Goals: Fair ADL Goals Pt Will Perform Grooming: sitting;with set-up Pt Will Perform Lower Body Dressing: with mod assist;with adaptive equipment Pt Will Transfer to Toilet: bedside commode;with mod assist Pt Will Perform Toileting - Clothing Manipulation and hygiene: with mod assist;sitting/lateral leans   OT Frequency:  Min 2X/week    Co-evaluation              AM-PAC OT "6 Clicks" Daily Activity     Outcome Measure Help from another person eating meals?: A Little Help from another person taking care of personal grooming?: A Lot Help from another person toileting, which includes using toliet, bedpan, or urinal?: Total Help from another person bathing (including washing, rinsing, drying)?: Total Help from another person to put on and taking off regular upper body clothing?: A Little Help from another person to put on and taking off regular lower body clothing?: Total 6 Click Score: 11   End of Session Nurse Communication: Mobility status  Activity Tolerance: Patient limited by pain Patient left: in bed;with call bell/phone within reach;with bed alarm set  OT Visit Diagnosis:  Other abnormalities of gait and mobility (R26.89);Muscle weakness (generalized) (M62.81);History of falling (Z91.81)                Time: 1610-9604 OT Time Calculation (min): 22 min Charges:  OT General Charges $OT Visit: 1 Visit OT Evaluation $OT Eval Moderate Complexity: 1 Mod OT Treatments $Self Care/Home Management : 8-22 mins  Rosaria Common M.S. OTR/L  12/11/23, 4:55 PM

## 2023-12-11 NOTE — Hospital Course (Signed)
 severe mitral regurgitation, aortic valve stenosis, HTN, HLD, frequent falls, OA bilateral knees, CKD llla, who presents to the ED for evaluation of a possible syncopal episode 3 days prior.  She was standing at the dresser when she fell onto the floor.  She is not sure whether she passed out.  Prior to this patient had been having difficulty with bearing weight and has been mostly staying in bed.  At the present time she complains of soreness on her bottom. ED course and data review: BP 172/80 with otherwise normal vitals  Labs notable for normal WBC, hemoglobin 11.1, troponin 49, moderate leuks on UA   EKG, personally viewed and interpreted showing sinus at 83 with no acute ST-T wave changes.  Trauma imaging including x-ray and CT right and left knee, CT head as well as x-ray hip, nonacute  Patient treated with NS boluses started on ceftriaxone given Tylenol  for pain  Ambulation was attempted in the ED however patient was unable to bear weight.  Hospitalist consulted for admission.

## 2023-12-11 NOTE — Progress Notes (Signed)
 Patient arrieved at 0310 from the ER accompanied by her nurse. Nephew is aware of transfer. C/O of buttock pain. PRN Oxycodone 5 mg immediate release  given per order. B/P elevated, PRN Hydralazine given per order. Safety maintained.

## 2023-12-11 NOTE — Care Management Obs Status (Signed)
 MEDICARE OBSERVATION STATUS NOTIFICATION   Patient Details  Name: LYNDSEE CASA MRN: [id number] Date of Birth: 1935-09-01   Medicare Observation Status Notification Given:  Yes    [place] L Salina Stanfield, LCSW 12/11/2023, 10:05 AM

## 2023-12-11 NOTE — Plan of Care (Signed)
  Problem: Cardiac: Goal: Will achieve and/or maintain adequate cardiac output Outcome: Progressing   Problem: Cardiac: Goal: Will achieve and/or maintain adequate cardiac output Outcome: Progressing   Problem: Health Behavior/Discharge Planning: Goal: Ability to manage health-related needs will improve Outcome: Progressing   Problem: Clinical Measurements: Goal: Ability to maintain clinical measurements within normal limits will improve Outcome: Progressing   Problem: Clinical Measurements: Goal: Will remain free from infection Outcome: Progressing   Problem: Activity: Goal: Risk for activity intolerance will decrease Outcome: Progressing   Problem: Nutrition: Goal: Adequate nutrition will be maintained Outcome: Progressing

## 2023-12-11 NOTE — Evaluation (Addendum)
 Physical Therapy Evaluation Patient Details Name: Regina Ross MRN: [id number] DOB: 1935/11/25 Today's Date: 12/11/2023  History of Present Illness  Regina Ross is a 88 y.o. female with a history of hypertension, arthritis who comes the ED due to fall at home.  Clinical Impression  PT arrives to evaluate just 2 minutes after OT evaluation which was poorly tolerated due to lack of pain meds in place- pt has not had anything for pain yet. Pt agreeable to assessment, tolerates partial ROM of BLE, cues given for slow, smooth controlled movements. Joint range increased with repetition, however Rt knee is not able to go >50 degrees due to patellafemoral pain limitations. Pt educated on self A/ROM during the day for joint nutrition and pain control. Will continue to follow. Recommend more consistent pain medication in anticipation of an established mobility routine with nursing to start with EOB for meals with assist.       If plan is discharge home, recommend the following:     Can travel by private vehicle   No    Equipment Recommendations None recommended by PT  Recommendations for Other Services       Functional Status Assessment Patient has had a recent decline in their functional status and demonstrates the ability to make significant improvements in function in a reasonable and predictable amount of time.     Precautions / Restrictions Precautions Precautions: Fall      Mobility  Bed Mobility               General bed mobility comments: Just attempted getting to EOB unable due to level of pain in bilateral knees and buttocks pain; still waiting on pain meds.    Transfers                        Ambulation/Gait                  Stairs            Wheelchair Mobility     Tilt Bed    Modified Rankin (Stroke Patients Only)       Balance                                             Pertinent Vitals/Pain Pain  Assessment Pain Assessment: 0-10    Home Living Family/patient expects to be discharged to:: Private residence Living Arrangements: Other relatives Available Help at Discharge: Family (Nephew) Type of Home: House Home Access: Level entry       Home Layout: One level Home Equipment: BSC/3in1;Rolling Environmental consultant (2 wheels);Wheelchair - manual;Grab bars - tub/shower;Hospital bed Additional Comments: Recently got hospital bed    Prior Function Prior Level of Function : Needs assist;History of Falls (last six months)             Mobility Comments: Using walker to pivot for all transfers, no very ambulatory at baseline ADLs Comments: Nephew assist with all ADL/IADL     Extremity/Trunk Assessment   Upper Extremity Assessment Upper Extremity Assessment: Overall WFL for tasks assessed;Generalized weakness    Lower Extremity Assessment Lower Extremity Assessment: Defer to PT evaluation;Generalized weakness;LLE deficits/detail;RLE deficits/detail RLE: Unable to fully assess due to pain LLE: Unable to fully assess due to pain       Communication   Communication Communication: No apparent difficulties  Cognition                                         Cueing       General Comments General comments (skin integrity, edema, etc.): Pt repositioned to off load pressure on buttocks    Exercises General Exercises - Lower Extremity Ankle Circles/Pumps: AROM, Both, 20 reps Short Arc Quad: AAROM, AROM, 10 reps, Limitations, Both Short Arc Quad Limitations: no assist needed LLE; maxA needed RLE Heel Slides: AAROM, Both, 10 reps Hip ABduction/ADduction: AAROM, Both, 10 reps   Assessment/Plan    PT Assessment Patient needs continued PT services  PT Problem List Decreased strength;Decreased range of motion;Decreased activity tolerance;Decreased balance;Decreased mobility       PT Treatment Interventions DME instruction;Gait training;Stair training;Functional  mobility training;Therapeutic exercise;Balance training;Therapeutic activities;Patient/family education    PT Goals (Current goals can be found in the Care Plan section)  Acute Rehab PT Goals PT Goal Formulation: Patient unable to participate in goal setting    Frequency Min 3X/week     Co-evaluation               AM-PAC PT "6 Clicks" Mobility  Outcome Measure Help needed turning from your back to your side while in a flat bed without using bedrails?: A Lot Help needed moving from lying on your back to sitting on the side of a flat bed without using bedrails?: A Lot Help needed moving to and from a bed to a chair (including a wheelchair)?: Total Help needed standing up from a chair using your arms (e.g., wheelchair or bedside chair)?: Total Help needed to walk in hospital room?: Total Help needed climbing 3-5 steps with a railing? : Total 6 Click Score: 8    End of Session   Activity Tolerance: Patient tolerated treatment well;Patient limited by pain Patient left: in bed;with call bell/phone within reach (pillow under Right hemipelvis, heels floating)   PT Visit Diagnosis: Difficulty in walking, not elsewhere classified (R26.2);Other abnormalities of gait and mobility (R26.89)    Time: 1308-6578 PT Time Calculation (min) (ACUTE ONLY): 28 min   Charges:   PT Evaluation $PT Eval Moderate Complexity: 1 Mod PT Treatments $Therapeutic Activity: 8-22 mins PT General Charges $$ ACUTE PT VISIT: 1 Visit        5:24 PM, 12/11/23 Dawn Eth, PT, DPT Physical Therapist - [facility]  [phone] (ASCOM)    Nakyiah Kuck C 12/11/2023, 5:20 PM

## 2023-12-11 NOTE — Assessment & Plan Note (Addendum)
 Present on admission, see full description below.  Numerous decubiti.  Foam dressing change every 3 days and as needed for swelling.  And changing position routinely.

## 2023-12-11 NOTE — Assessment & Plan Note (Addendum)
 History of severe mitral regurgitation and aortic valve stenosis Fall, low suspicion for syncope Follow-up with cardiology as outpatient.

## 2023-12-11 NOTE — Assessment & Plan Note (Addendum)
 Patient completed antibiotics

## 2023-12-11 NOTE — H&P (Signed)
 History and Physical    Patient: Regina Ross:[id number] DOB: 05/18/1936 DOA: 12/10/2023 DOS: the patient was seen and examined on 12/11/2023 PCP: Sharyne Degree, FNP  Patient coming from: Home  Chief Complaint:  Chief Complaint  Patient presents with   Fall    HPI: Regina Ross is a 88 y.o. female with medical history significant for severe mitral regurgitation, aortic valve stenosis, HTN, HLD, frequent falls, OA bilateral knees, CKD llla, who presents to the ED for evaluation of a difficulty ambulating since a fall a week prior.  At baseline she ambulates with a walker and states that a week ago she was trying to put something in a drawer and lost her balance and fell.  Since then she has been essentially bedbound and unable to walk and now is having soreness on her buttocks that is prompting a decision to come to the emergency room to get checked out.  She states it is unlikely she passed out when she fell and just felt like she lost her balance.  . ED course and data review: BP 172/80 with otherwise normal vitals  Labs notable for normal WBC, hemoglobin 11.1, troponin 49, moderate leuks on UA   EKG, personally viewed and interpreted showing sinus at 83 with no acute ST-T wave changes.  Trauma imaging including x-ray and CT right and left knee, CT head as well as x-ray hip, nonacute  Patient treated with NS boluses started on ceftriaxone given Tylenol  for pain  Ambulation was attempted in the ED however patient was unable to bear weight.  Hospitalist consulted for admission.     Review of Systems: As mentioned in the history of present illness. All other systems reviewed and are negative.  Past Medical History:  Diagnosis Date   Arthritis    right knee, right hip   Hypertension    Vertigo    Wears dentures    full upper and lower   Past Surgical History:  Procedure Laterality Date   CATARACT EXTRACTION W/PHACO Right 02/19/2021   Procedure: CATARACT EXTRACTION  PHACO AND INTRAOCULAR LENS PLACEMENT (IOC) RIGHT HEALON 5 VISION BLUE 38.18 03:28.4;  Surgeon: Annell Kidney, MD;  Location: [facility];  Service: Ophthalmology;  Laterality: Right;   TOTAL HIP ARTHROPLASTY Left    Social History:  reports that she has never smoked. She has never used smokeless tobacco. She reports that she does not drink alcohol and does not use drugs.  No Known Allergies  Family History  Problem Relation Age of Onset   Breast cancer Mother 29    Prior to Admission medications   Medication Sig Start Date End Date Taking? Authorizing Provider  atenolol (TENORMIN) 50 MG tablet Take 50 mg by mouth daily.    [provider]  Cyanocobalamin (B-12 PO) Take 3,000 mg by mouth daily.    [provider]  dapagliflozin propanediol (FARXIGA) 5 MG TABS tablet Take 5 mg by mouth daily.    [provider]  doxazosin (CARDURA) 4 MG tablet Take 4 mg by mouth daily.    [provider]  etodolac (LODINE) 200 MG capsule Take 200 mg by mouth 2 (two) times daily.    [provider]  meclizine (ANTIVERT) 25 MG tablet Take 25 mg by mouth daily.    [provider]  naproxen (NAPROSYN) 500 MG tablet Take 500 mg by mouth 2 (two) times daily with a meal.    [provider]  predniSONE (DELTASONE) 2.5 MG tablet Take  2.5 mg by mouth daily with breakfast. Patient not taking: Reported on 02/05/2021    [provider]  simvastatin (ZOCOR) 20 MG tablet Take 20 mg by mouth daily.    [provider]  TIMOLOL  MALEATE OP Apply to eye 2 (two) times daily.    [provider]  VITAMIN D PO Take by mouth daily.    [provider]    Physical Exam: Vitals:   12/10/23 2200 12/10/23 2228 12/11/23 0119 12/11/23 0124  BP: (!) 174/61  106/85 (!) 148/62  Pulse: 68 70 77 80  Resp: 20 16  18   Temp:    98 F (36.7 C)  TempSrc:    Oral  SpO2: 96% 97% 95% 97%  Weight:      Height:       Physical  Exam Vitals and nursing note reviewed.  Constitutional:      General: She is not in acute distress. HENT:     Head: Normocephalic and atraumatic.  Cardiovascular:     Rate and Rhythm: Normal rate and regular rhythm.     Heart sounds: Normal heart sounds.  Pulmonary:     Effort: Pulmonary effort is normal.     Breath sounds: Normal breath sounds.  Abdominal:     Palpations: Abdomen is soft.     Tenderness: There is no abdominal tenderness.  Skin:    Comments: Stage I and II decubitus ulcers on buttocks-see pics in media file  Neurological:     Mental Status: Mental status is at baseline.        Labs on Admission: I have personally reviewed following labs and imaging studies  CBC: Recent Labs  Lab 12/10/23 1939  WBC 8.5  NEUTROABS 5.5  HGB 11.1*  HCT 34.4*  MCV 95.8  PLT 174   Basic Metabolic Panel: Recent Labs  Lab 12/10/23 1939 12/10/23 2114  NA 142  --   K 3.9  --   CL 107  --   CO2 25  --   GLUCOSE 108*  --   BUN 18  --   CREATININE 0.76  --   CALCIUM 8.9  --   MG  --  1.9   GFR: Estimated Creatinine Clearance: 55 mL/min (by C-G formula based on SCr of 0.76 mg/dL). Liver Function Tests: Recent Labs  Lab 12/10/23 1939  AST 17  ALT 9  ALKPHOS 57  BILITOT 0.9  PROT 6.2*  ALBUMIN 3.0*   No results for input(s): "LIPASE", "AMYLASE" in the last 168 hours. No results for input(s): "AMMONIA" in the last 168 hours. Coagulation Profile: No results for input(s): "INR", "PROTIME" in the last 168 hours. Cardiac Enzymes: Recent Labs  Lab 12/10/23 1939  CKTOTAL 57   BNP (last 3 results) No results for input(s): "PROBNP" in the last 8760 hours. HbA1C: No results for input(s): "HGBA1C" in the last 72 hours. CBG: No results for input(s): "GLUCAP" in the last 168 hours. Lipid Profile: No results for input(s): "CHOL", "HDL", "LDLCALC", "TRIG", "CHOLHDL", "LDLDIRECT" in the last 72 hours. Thyroid Function Tests: Recent Labs    12/10/23 2114  TSH  0.385   Anemia Panel: No results for input(s): "VITAMINB12", "FOLATE", "FERRITIN", "TIBC", "IRON", "RETICCTPCT" in the last 72 hours. Urine analysis:    Component Value Date/Time   COLORURINE YELLOW (A) 12/11/2023 0027   APPEARANCEUR HAZY (A) 12/11/2023 0027   LABSPEC 1.013 12/11/2023 0027   PHURINE 5.0 12/11/2023 0027   GLUCOSEU NEGATIVE 12/11/2023 0027   HGBUR NEGATIVE 12/11/2023  0027   BILIRUBINUR NEGATIVE 12/11/2023 0027   KETONESUR NEGATIVE 12/11/2023 0027   PROTEINUR NEGATIVE 12/11/2023 0027   NITRITE NEGATIVE 12/11/2023 0027   LEUKOCYTESUR MODERATE (A) 12/11/2023 0027    Radiological Exams on Admission: CT KNEE LEFT WO CONTRAST Result Date: 12/10/2023 CLINICAL DATA:  Trauma EXAM: CT OF THE LEFT KNEE WITHOUT CONTRAST TECHNIQUE: Multidetector CT imaging of the left knee was performed according to the standard protocol. Multiplanar CT image reconstructions were also generated. RADIATION DOSE REDUCTION: This exam was performed according to the departmental dose-optimization program which includes automated exposure control, adjustment of the mA and/or kV according to patient size and/or use of iterative reconstruction technique. COMPARISON:  Left knee x-ray same day FINDINGS: Bones/Joint/Cartilage The bones are osteopenic. No focal osseous lesions are seen. There is no acute fracture or dislocation. There is tricompartmental joint space narrowing and osteophyte formation. This is most significant/severe in the lateral compartment. Moderate joint effusion present. Ligaments Suboptimally assessed by CT. Muscles and Tendons No intramuscular hematoma identified. There is edema and stranding in the region of the medial collateral ligament and medial patellar retinaculum. Soft tissues Peripheral vascular calcifications are present. IMPRESSION: 1. No acute fracture or dislocation. 2. Moderate joint effusion. 3. Edema and stranding in the region of the medial collateral ligament and medial patellar  retinaculum. Correlate clinically for injury. 4. Tricompartmental degenerative changes, most significant in the lateral compartment. Electronically Signed   By: Tyron Gallon M.D.   On: 12/10/2023 23:29   CT Knee Right Wo Contrast Result Date: 12/10/2023 CLINICAL DATA:  Knee injury, occult fracture suspected EXAM: CT OF THE RIGHT KNEE WITHOUT CONTRAST TECHNIQUE: Multidetector CT imaging of the right knee was performed according to the standard protocol. Multiplanar CT image reconstructions were also generated. RADIATION DOSE REDUCTION: This exam was performed according to the departmental dose-optimization program which includes automated exposure control, adjustment of the mA and/or kV according to patient size and/or use of iterative reconstruction technique. COMPARISON:  Plain films today FINDINGS: Bones/Joint/Cartilage Advanced tricompartment osteoarthritis in the right knee with severe joint space narrowing and spurring. Small so stated joint effusion. No fracture visualized. No subluxation or dislocation. Ligaments Suboptimally assessed by CT. Muscles and Tendons Negative Soft tissues Negative IMPRESSION: No acute bony abnormality. Severe osteoarthritis with small joint effusion. Electronically Signed   By: Janeece Mechanic M.D.   On: 12/10/2023 23:27   DG HIP UNILAT WITH PELVIS 2-3 VIEWS RIGHT Result Date: 12/10/2023 CLINICAL DATA:  Fall EXAM: DG HIP (WITH OR WITHOUT PELVIS) 2-3V RIGHT COMPARISON:  None Available. FINDINGS: Prior left hip replacement. Advanced degenerative changes in the right hip. No acute bony abnormality. Specifically, no fracture, subluxation, or dislocation. IMPRESSION: No acute bony abnormality. Electronically Signed   By: Janeece Mechanic M.D.   On: 12/10/2023 19:30   DG Knee Complete 4 Views Left Result Date: 12/10/2023 CLINICAL DATA:  Left knee pain, swelling EXAM: LEFT KNEE - COMPLETE 4+ VIEW COMPARISON:  None Available. FINDINGS: Advanced tricompartment degenerative changes. No  significant joint effusion. No acute bony abnormality. Specifically, no fracture, subluxation, or dislocation. IMPRESSION: Advanced degenerative changes.  No acute bony abnormality. Electronically Signed   By: Janeece Mechanic M.D.   On: 12/10/2023 19:29   CT Head Wo Contrast Result Date: 12/10/2023 CLINICAL DATA:  Mental status change, unknown cause.  Fall. EXAM: CT HEAD WITHOUT CONTRAST TECHNIQUE: Contiguous axial images were obtained from the base of the skull through the vertex without intravenous contrast. RADIATION DOSE REDUCTION: This exam was performed according to  the departmental dose-optimization program which includes automated exposure control, adjustment of the mA and/or kV according to patient size and/or use of iterative reconstruction technique. COMPARISON:  11/09/2023 FINDINGS: Brain: There is atrophy and chronic small vessel disease changes. 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 No acute intracranial abnormality. Specifically, no hemorrhage, hydrocephalus, mass lesion, acute infarction, or significant intracranial injury. Vascular: No hyperdense vessel or unexpected calcification. Skull: No acute calvarial abnormality. Sinuses/Orbits: No acute findings Other: None IMPRESSION: Atrophy, chronic microvascular disease. No acute intracranial abnormality. Electronically Signed   By: Janeece Mechanic M.D.   On: 12/10/2023 19:29   DG Knee Complete 4 Views Right Result Date: 12/10/2023 CLINICAL DATA:  FALL EXAM: RIGHT KNEE - COMPLETE 4+ VIEW COMPARISON:  None Available. FINDINGS: THERE IS A SMALL JOINT EFFUSION. THERE IS NO ACUTE FRACTURE OR DISLOCATION IDENTIFIED. THERE IS SEVERE TRICOMPARTMENTAL OSTEOARTHROSIS WITH JOINT SPACE NARROWING AND OSTEOPHYTE FORMATION. IMPRESSION: 1. No acute fracture or dislocation. 2. Small joint effusion. 3. Severe tricompartmental osteoarthrosis. Electronically Signed   By: Tyron Gallon M.D.   On: 12/10/2023 16:59   DG Ankle Complete  Left Result Date: 12/10/2023 CLINICAL DATA:  Fall and left ankle pain. EXAM: LEFT ANKLE COMPLETE - 3+ VIEW COMPARISON:  None Available. FINDINGS: There is no acute fracture or dislocation. The bones are osteopenic. The ankle mortise is intact. The soft tissues are unremarkable. IMPRESSION: 1. No acute fracture or dislocation. 2. Osteopenia. Electronically Signed   By: Angus Bark M.D.   On: 12/10/2023 16:54   Data Reviewed for HPI: Relevant notes from primary care and specialist visits, past discharge summaries as available in EHR, including Care Everywhere. Prior diagnostic testing as pertinent to current admission diagnoses Updated medications and problem lists for reconciliation ED course, including vitals, labs, imaging, treatment and response to treatment Triage notes, nursing and pharmacy notes and ED provider's notes Notable results as noted above in HPI      Assessment and Plan: Aortic valve stenosis History of severe mitral regurgitation and aortic valve stenosis Fall, low suspicion for syncope Head CT nonacute Syncope workup to include continuous cardiac monitoring, echocardiogram, carotid Doppler Received IV fluid boluses in the ED Hemodynamic monitoring Consider cardiology consult based on results of echo  Urinary tract infection Urinalysis consistent with infection Will continue Rocephin Follow cultures  Elevated troponin Troponin 47 but EKG nonacute and patient denies chest pain Suspect demand ischemia Continue to trend  Ambulatory dysfunction History of a fall History of osteoarthritis PT OT consult  Decubitus ulcers Wound care consult  Anemia Hemoglobin 11.7 Will get anemia panel  HTN (hypertension) BP somewhat elevated Will hold home atenolol and doxazosin given syncopal episode Hydralazine as needed for now     DVT prophylaxis: Lovenox  Consults: none  Advance Care Planning: full code  Family Communication: none  Disposition Plan:  Back to previous home environment  Severity of Illness: The appropriate patient status for this patient is OBSERVATION. Observation status is judged to be reasonable and necessary in order to provide the required intensity of service to ensure the patient's safety. The patient's presenting symptoms, physical exam findings, and initial radiographic and laboratory data in the context of their medical condition is felt to place them at decreased risk for further clinical deterioration. Furthermore, it is anticipated that the patient will be medically stable for discharge from the hospital within 2 midnights of admission.   Author: Lanetta Pion, MD 12/11/2023 2:17 AM  For on call review [email or web address].

## 2023-12-11 NOTE — Assessment & Plan Note (Deleted)
 Troponin 47 but EKG nonacute and patient denies chest pain Suspect demand ischemia Continue to trend

## 2023-12-11 NOTE — ED Provider Notes (Signed)
 Care of this patient assumed from prior physician at 2300 pending completion of workup and disposition. Please see prior physician note for further details.  Briefly this is an 88 year old female who presents following a possible syncopal episode with generalized weakness.   Labs with mild anemia.  Additional troponin elevated at 42, slightly uptrending to 49 on repeat.  CT head and initial trauma G without significant abnormality.  CT of bilateral knees pending to evaluate for occult tibial plateau fracture.  Urinalysis also pending.  CTs without acute fracture.  Urinalysis is concerning for infection with moderate leukocyte esterase, 21-50 white blood cells on a clean sample.  Does not meet SIRS criteria, ordered for urine culture and empiric Rocephin.  With concern for syncope as well as generalized weakness in the setting of UTI, do think patient is appropriate for admission.  Will reach out to hospitalist team.  Discussed with Dr. Vallarie Gauze.  She will evaluate for anticipated admission.     Claria Crofts, MD 12/11/23 [phone]

## 2023-12-11 NOTE — Progress Notes (Signed)
  Echocardiogram 2D Echocardiogram has been performed.  Regina Ross 12/11/2023, 12:48 PM

## 2023-12-12 DIAGNOSIS — R262 Difficulty in walking, not elsewhere classified: Secondary | ICD-10-CM | POA: Diagnosis not present

## 2023-12-12 DIAGNOSIS — M170 Bilateral primary osteoarthritis of knee: Secondary | ICD-10-CM

## 2023-12-12 DIAGNOSIS — M1611 Unilateral primary osteoarthritis, right hip: Secondary | ICD-10-CM

## 2023-12-12 DIAGNOSIS — Y92009 Unspecified place in unspecified non-institutional (private) residence as the place of occurrence of the external cause: Secondary | ICD-10-CM | POA: Diagnosis not present

## 2023-12-12 DIAGNOSIS — W19XXXA Unspecified fall, initial encounter: Secondary | ICD-10-CM | POA: Diagnosis not present

## 2023-12-12 LAB — URINE CULTURE

## 2023-12-12 MED ORDER — DICLOFENAC SODIUM 1 % EX GEL
2.0000 g | Freq: Three times a day (TID) | CUTANEOUS | Status: DC
  Administered 2023-12-12 – 2023-12-16 (×12): 2 g via TOPICAL
  Filled 2023-12-12: qty 100

## 2023-12-12 NOTE — Consult Note (Signed)
 ORTHOPAEDIC CONSULTATION  REQUESTING PHYSICIAN: Melvinia Stager, MD  Chief Complaint:   Bilateral knee and right hip pain  History of Present Illness: Regina Ross is a 88 y.o. female  with a past medical history of severe mitral regurgitation, aortic valve stenosis, hypertension, hyperlipidemia, rheumatoid arthritis per patient, CKD 3, and multiple recent falls she reports her last fall was over a week ago and she has been ambulatory since that time with a walker.  She reports chronic pain in all of her joints from her arthritis and has been treated in the past with medications and has not had any recent treatments.  She reports for the last few weeks she is essentially been bedbound unable to walk due to soreness in her butt and difficulty walking with pain in both of her lower legs and her right hip.  She has not had injections in her knees or her hip that she can remember.  She is not currently on medication for arthritis.  Currently being treated for a urinary tract infection.  Past Medical History:  Diagnosis Date   Arthritis    right knee, right hip   Hypertension    Vertigo    Wears dentures    full upper and lower   Past Surgical History:  Procedure Laterality Date   CATARACT EXTRACTION W/PHACO Right 02/19/2021   Procedure: CATARACT EXTRACTION PHACO AND INTRAOCULAR LENS PLACEMENT (IOC) RIGHT HEALON 5 VISION BLUE 38.18 03:28.4;  Surgeon: Annell Kidney, MD;  Location: [facility];  Service: Ophthalmology;  Laterality: Right;   TOTAL HIP ARTHROPLASTY Left    Social History   Socioeconomic History   Marital status: Widowed    Spouse name: Not on file   Number of children: Not on file   Years of education: Not on file   Highest education level: Not on file  Occupational History   Not on file  Tobacco Use   Smoking status: Never   Smokeless tobacco: Never  Vaping Use   Vaping status: Never Used   Substance and Sexual Activity   Alcohol use: Never   Drug use: Never   Sexual activity: Not on file  Other Topics Concern   Not on file  Social History Narrative   Not on file   Social Drivers of Health   Financial Resource Strain: Not on file  Food Insecurity: No Food Insecurity (12/11/2023)   Hunger Vital Sign    Worried About Running Out of Food in the Last Year: Never true    Ran Out of Food in the Last Year: Never true  Transportation Needs: No Transportation Needs (12/11/2023)   PRAPARE - Administrator, Civil Service (Medical): No    Lack of Transportation (Non-Medical): No  Physical Activity: Not on file  Stress: Not on file  Social Connections: Socially Isolated (12/11/2023)   Social Connection and Isolation Panel [NHANES]    Frequency of Communication with Friends and Family: Once a week    Frequency of Social Gatherings with Friends and Family: Once a week    Attends Religious Services: 1 to 4 times per year    Active Member of [employer] or Organizations: No    Attends Banker Meetings: Never    Marital Status: Widowed   Family History  Problem Relation Age of Onset   Breast cancer Mother 52   No Known Allergies Prior to Admission medications   Medication Sig Start Date End Date Taking? Authorizing Provider  aspirin EC 81 MG tablet  Take 81 mg by mouth daily. 12/10/22  Yes [provider]  atenolol (TENORMIN) 50 MG tablet Take 50 mg by mouth daily.   Yes [provider]  dapagliflozin propanediol (FARXIGA) 5 MG TABS tablet Take 5 mg by mouth daily.   Yes [provider]  doxazosin (CARDURA) 4 MG tablet Take 4 mg by mouth daily.   Yes [provider]  etodolac (LODINE) 200 MG capsule Take 200 mg by mouth 2 (two) times daily.   Yes [provider]  meclizine (ANTIVERT) 25 MG tablet Take 25 mg by mouth daily.   Yes [provider]  naproxen (NAPROSYN) 500 MG tablet Take 500 mg by mouth 2 (two)  times daily with a meal.   Yes [provider]  Propylene Glycol (SYSTANE BALANCE) 0.6 % SOLN Apply 1 drop to eye as needed (Dry Eyes).   Yes [provider]  simvastatin (ZOCOR) 20 MG tablet Take 20 mg by mouth at bedtime.   Yes [provider]   ECHOCARDIOGRAM COMPLETE Result Date: 12/11/2023    ECHOCARDIOGRAM REPORT   Patient Name:   Regina Ross Date of Exam: 12/11/2023 Medical Rec #:  [id number]     Height:       67.0 in Accession #:    [id number]    Weight:       177.2 lb Date of Birth:  1936/06/17     BSA:          1.921 m Patient Age:    87 years      BP:           119/63 mmHg Patient Gender: F             HR:           75 bpm. Exam Location:  [facility] Procedure: 2D Echo, Cardiac Doppler and Color Doppler (Both Spectral and Color            Flow Doppler were utilized during procedure). Indications:     Syncope R55  History:         Patient has no prior history of Echocardiogram examinations.  Sonographer:     Clenton Czech RDCS, FASE Referring Phys:  [id number] Lanetta Pion Diagnosing Phys: Lanell Pinta Custovic IMPRESSIONS  1. Left ventricular ejection fraction, by estimation, is 60 to 65%. The left ventricle has normal function. The left ventricle has no regional wall motion abnormalities. Left ventricular diastolic parameters are consistent with Grade I diastolic dysfunction (impaired relaxation).  2. Right ventricular systolic function is normal. The right ventricular size is normal. There is normal pulmonary artery systolic pressure. The estimated right ventricular systolic pressure is 33.9 mmHg.  3. The mitral valve is normal in structure. Trivial mitral valve regurgitation.  4. The aortic valve is calcified. Aortic valve regurgitation is mild to moderate. Severe aortic valve stenosis. Aortic regurgitation PHT measures 414 msec. Aortic valve area, by VTI measures 0.78 cm. Aortic valve mean gradient measures 55.8 mmHg. Aortic valve Vmax measures 4.81 m/s.  5. The inferior vena  cava is normal in size with greater than 50% respiratory variability, suggesting right atrial pressure of 3 mmHg. FINDINGS  Left Ventricle: Left ventricular ejection fraction, by estimation, is 60 to 65%. The left ventricle has normal function. The left ventricle has no regional wall motion abnormalities. The left ventricular internal cavity size was normal in size. There is  no left ventricular hypertrophy. Left ventricular diastolic parameters are consistent with Grade I diastolic dysfunction (impaired relaxation).  Right Ventricle: The right ventricular size is normal. No increase in right ventricular wall thickness. Right ventricular systolic function is normal. There is normal pulmonary artery systolic pressure. The tricuspid regurgitant velocity is 2.78 m/s, and  with an assumed right atrial pressure of 3 mmHg, the estimated right ventricular systolic pressure is 33.9 mmHg. Left Atrium: Left atrial size was normal in size. Right Atrium: Right atrial size was normal in size. Pericardium: There is no evidence of pericardial effusion. Mitral Valve: The mitral valve is normal in structure. Trivial mitral valve regurgitation. Tricuspid Valve: The tricuspid valve is normal in structure. Tricuspid valve regurgitation is mild. Aortic Valve: The aortic valve is calcified. Aortic valve regurgitation is mild to moderate. Aortic regurgitation PHT measures 414 msec. Severe aortic stenosis is present. Aortic valve mean gradient measures 55.8 mmHg. Aortic valve peak gradient measures  92.4 mmHg. Aortic valve area, by VTI measures 0.78 cm. Pulmonic Valve: The pulmonic valve was normal in structure. Pulmonic valve regurgitation is not visualized. No evidence of pulmonic stenosis. Aorta: The aortic root is normal in size and structure. Venous: The inferior vena cava is normal in size with greater than 50% respiratory variability, suggesting right atrial pressure of 3 mmHg. IAS/Shunts: No atrial level shunt detected by color  flow Doppler.  LEFT VENTRICLE PLAX 2D LVIDd:         4.20 cm   Diastology LVIDs:         2.90 cm   LV e' medial:    4.79 cm/s LV PW:         1.30 cm   LV E/e' medial:  10.9 LV IVS:        1.20 cm   LV e' lateral:   3.59 cm/s LVOT diam:     2.00 cm   LV E/e' lateral: 14.6 LV SV:         90 LV SV Index:   47 LVOT Area:     3.14 cm  RIGHT VENTRICLE RV Basal diam:  3.30 cm RV S prime:     14.00 cm/s TAPSE (M-mode): 2.2 cm LEFT ATRIUM           Index        RIGHT ATRIUM           Index LA diam:      3.40 cm 1.77 cm/m   RA Area:     10.60 cm LA Vol (A4C): 46.4 ml 24.15 ml/m  RA Volume:   24.90 ml  12.96 ml/m  AORTIC VALVE AV Area (Vmax):    0.84 cm AV Area (Vmean):   0.78 cm AV Area (VTI):     0.78 cm AV Vmax:           480.75 cm/s AV Vmean:          351.500 cm/s AV VTI:            1.160 m AV Peak Grad:      92.4 mmHg AV Mean Grad:      55.8 mmHg LVOT Vmax:         128.00 cm/s LVOT Vmean:        87.400 cm/s LVOT VTI:          0.288 m LVOT/AV VTI ratio: 0.25 AI PHT:            414 msec  AORTA Ao Root diam: 2.60 cm Ao Asc diam:  2.90 cm MITRAL VALVE  TRICUSPID VALVE MV Area (PHT): 2.91 cm     TR Peak grad:   30.9 mmHg MV Decel Time: 261 msec     TR Vmax:        278.00 cm/s MV E velocity: 52.40 cm/s MV A velocity: 125.00 cm/s  SHUNTS MV E/A ratio:  0.42         Systemic VTI:  0.29 m                             Systemic Diam: 2.00 cm Lanell Pinta Custovic Electronically signed by Isabell Manzanilla Signature Date/Time: 12/11/2023/2:28:32 PM    Final    CT KNEE LEFT WO CONTRAST Result Date: 12/10/2023 CLINICAL DATA:  Trauma EXAM: CT OF THE LEFT KNEE WITHOUT CONTRAST TECHNIQUE: Multidetector CT imaging of the left knee was performed according to the standard protocol. Multiplanar CT image reconstructions were also generated. RADIATION DOSE REDUCTION: This exam was performed according to the departmental dose-optimization program which includes automated exposure control, adjustment of the mA and/or kV according  to patient size and/or use of iterative reconstruction technique. COMPARISON:  Left knee x-ray same day FINDINGS: Bones/Joint/Cartilage The bones are osteopenic. No focal osseous lesions are seen. There is no acute fracture or dislocation. There is tricompartmental joint space narrowing and osteophyte formation. This is most significant/severe in the lateral compartment. Moderate joint effusion present. Ligaments Suboptimally assessed by CT. Muscles and Tendons No intramuscular hematoma identified. There is edema and stranding in the region of the medial collateral ligament and medial patellar retinaculum. Soft tissues Peripheral vascular calcifications are present. IMPRESSION: 1. No acute fracture or dislocation. 2. Moderate joint effusion. 3. Edema and stranding in the region of the medial collateral ligament and medial patellar retinaculum. Correlate clinically for injury. 4. Tricompartmental degenerative changes, most significant in the lateral compartment. Electronically Signed   By: Tyron Gallon M.D.   On: 12/10/2023 23:29   CT Knee Right Wo Contrast Result Date: 12/10/2023 CLINICAL DATA:  Knee injury, occult fracture suspected EXAM: CT OF THE RIGHT KNEE WITHOUT CONTRAST TECHNIQUE: Multidetector CT imaging of the right knee was performed according to the standard protocol. Multiplanar CT image reconstructions were also generated. RADIATION DOSE REDUCTION: This exam was performed according to the departmental dose-optimization program which includes automated exposure control, adjustment of the mA and/or kV according to patient size and/or use of iterative reconstruction technique. COMPARISON:  Plain films today FINDINGS: Bones/Joint/Cartilage Advanced tricompartment osteoarthritis in the right knee with severe joint space narrowing and spurring. Small so stated joint effusion. No fracture visualized. No subluxation or dislocation. Ligaments Suboptimally assessed by CT. Muscles and Tendons Negative Soft  tissues Negative IMPRESSION: No acute bony abnormality. Severe osteoarthritis with small joint effusion. Electronically Signed   By: Janeece Mechanic M.D.   On: 12/10/2023 23:27   DG HIP UNILAT WITH PELVIS 2-3 VIEWS RIGHT Result Date: 12/10/2023 CLINICAL DATA:  Fall EXAM: DG HIP (WITH OR WITHOUT PELVIS) 2-3V RIGHT COMPARISON:  None Available. FINDINGS: Prior left hip replacement. Advanced degenerative changes in the right hip. No acute bony abnormality. Specifically, no fracture, subluxation, or dislocation. IMPRESSION: No acute bony abnormality. Electronically Signed   By: Janeece Mechanic M.D.   On: 12/10/2023 19:30   DG Knee Complete 4 Views Left Result Date: 12/10/2023 CLINICAL DATA:  Left knee pain, swelling EXAM: LEFT KNEE - COMPLETE 4+ VIEW COMPARISON:  None Available. FINDINGS: Advanced tricompartment degenerative changes. No significant joint effusion. No acute bony abnormality. Specifically,  no fracture, subluxation, or dislocation. IMPRESSION: Advanced degenerative changes.  No acute bony abnormality. Electronically Signed   By: Janeece Mechanic M.D.   On: 12/10/2023 19:29   CT Head Wo Contrast Result Date: 12/10/2023 CLINICAL DATA:  Mental status change, unknown cause.  Fall. EXAM: CT HEAD WITHOUT CONTRAST TECHNIQUE: Contiguous axial images were obtained from the base of the skull through the vertex without intravenous contrast. RADIATION DOSE REDUCTION: This exam was performed according to the departmental dose-optimization program which includes automated exposure control, adjustment of the mA and/or kV according to patient size and/or use of iterative reconstruction technique. COMPARISON:  11/09/2023 FINDINGS: Brain: There is atrophy and chronic small vessel disease changes. 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 No acute intracranial abnormality. Specifically, no hemorrhage, hydrocephalus, mass lesion, acute infarction, or significant intracranial injury. Vascular: No  hyperdense vessel or unexpected calcification. Skull: No acute calvarial abnormality. Sinuses/Orbits: No acute findings Other: None IMPRESSION: Atrophy, chronic microvascular disease. No acute intracranial abnormality. Electronically Signed   By: Janeece Mechanic M.D.   On: 12/10/2023 19:29   DG Knee Complete 4 Views Right Result Date: 12/10/2023 CLINICAL DATA:  FALL EXAM: RIGHT KNEE - COMPLETE 4+ VIEW COMPARISON:  None Available. FINDINGS: THERE IS A SMALL JOINT EFFUSION. THERE IS NO ACUTE FRACTURE OR DISLOCATION IDENTIFIED. THERE IS SEVERE TRICOMPARTMENTAL OSTEOARTHROSIS WITH JOINT SPACE NARROWING AND OSTEOPHYTE FORMATION. IMPRESSION: 1. No acute fracture or dislocation. 2. Small joint effusion. 3. Severe tricompartmental osteoarthrosis. Electronically Signed   By: Tyron Gallon M.D.   On: 12/10/2023 16:59   DG Ankle Complete Left Result Date: 12/10/2023 CLINICAL DATA:  Fall and left ankle pain. EXAM: LEFT ANKLE COMPLETE - 3+ VIEW COMPARISON:  None Available. FINDINGS: There is no acute fracture or dislocation. The bones are osteopenic. The ankle mortise is intact. The soft tissues are unremarkable. IMPRESSION: 1. No acute fracture or dislocation. 2. Osteopenia. Electronically Signed   By: Angus Bark M.D.   On: 12/10/2023 16:54    Positive ROS: All other systems have been reviewed and were otherwise negative with the exception of those mentioned in the HPI and as above.  Physical Exam: General:  Alert, no acute distress Psychiatric:  Patient is competent for consent with normal mood and affect   Cardiovascular:  No pedal edema Respiratory:  No wheezing, non-labored breathing GI:  Abdomen is soft and non-tender Skin:  No lesions in the area of chief complaint Neurologic:  Sensation intact distally Lymphatic:  No axillary or cervical lymphadenopathy  Orthopedic Exam:  Bilateral lower extremity and right upper extremity exams Skin intact with no wounds no erythema no significant swelling or  effusion in either knee or swelling over the hip Tender to palpation over the medial lateral joint line of both knees no tenderness over the right hip No pain with micromotion of either knee or the hip no pain with logroll of the right hip or simulated axial load with distraction The right knee shows obvious bony deformity consistent with arthritic changes there is crepitus through range of motion 10 degrees to 90 degrees of motion pain at end range of motion localized over the medial lateral knee and the patella area no instability in the knee with varus and valgus stress testing compartments all soft neurovascular intact The left knee shows similar bony deformity consistent with arthritic changes as well as crepitus the range of motion 5 to 90 degrees with pain at end range of motion localized over the medial and lateral knee.  Stable to varus and valgus stress testing.  Neurovascular intact distally  Right hip exam pain with hip flexion and internal rotation localized to the groin internal rotation 0 degrees external rotation 40 degrees hip flexion limited to 90 degrees secondary to bony block and crepitus through range of motion.  No pain with small range of motion.    X-rays:  X-rays and CT scans of the bilateral knees and right hip images and report reviewed by myself.  There is severe degenerative changes in all 3 joints with no evidence of any fractures.  No dislocations or other notable significant findings.  Agree with radiology interpretation.  Assessment: Bilateral knee arthritis, right hip arthritis  Plan: I reviewed the clinical and radiographic findings with the patient and discussed treatment options.  She has chronic degenerative changes in all 3 joints consistent with either rheumatoid or osteoarthritis.  She does report a history of likely rheumatoid arthritis and is not currently undergoing treatment.  We discussed the possibility of injections and other conservative treatments  with braces and the possibility of surgery with potential future hip or knee replacement.  At this time the patient would not like to do any sort of surgical interventions for her joints.  I recommend continue with anti-inflammatories as tolerated we would avoid any injections at this time as she is actively being treated for urinary tract infection we try to avoid injections in the hospital setting.  I gave her information about follow-up and would have her see me in the office at [facility] within the next few weeks to consider outpatient injections.  All questions answered and she agrees with the above plan.  No plan for acute orthopedic intervention on this admission.    Venus Ginsberg MD  Beeper #:  [phone]  12/12/2023 1:40 PM

## 2023-12-12 NOTE — Progress Notes (Signed)
 [facility]  - [place] at [facility]   PATIENT NAME: Regina Ross    MR#:  [id number]  DATE OF BIRTH:  1935/07/31  SUBJECTIVE:  No family at bedside. Patient has lately not been able to ambulate for last 1 to 2 weeks. She is much bedbound at home health wise down. Patient is complaining of right knee pains and swelling.  Denies any chest pain lightheadedness or dizziness.    VITALS:  Blood pressure (!) 157/89, pulse 76, temperature 98.6 F (37 C), resp. rate 16, height 5\' 7"  (1.702 m), weight 80.4 kg, SpO2 100%.  PHYSICAL EXAMINATION:   GENERAL:  88 y.o.-year-old patient with no acute distress. Morbidly obese LUNGS: Normal breath sounds bilaterally, no wheezing CARDIOVASCULAR: S1, S2 normal. No murmur   ABDOMEN: Soft, nontender, nondistended. Bowel sounds present.  EXTREMITIES: right knee tenderness. Decreased range of motion.    NEUROLOGIC: nonfocal  patient is alert and awake SKIN: superficial skin breakdown on the buttock.  LABORATORY PANEL:  CBC Recent Labs  Lab 12/10/23 1939  WBC 8.5  HGB 11.1*  HCT 34.4*  PLT 174    Chemistries  Recent Labs  Lab 12/10/23 1939 12/10/23 2114  NA 142  --   K 3.9  --   CL 107  --   CO2 25  --   GLUCOSE 108*  --   BUN 18  --   CREATININE 0.76  --   CALCIUM 8.9  --   MG  --  1.9  AST 17  --   ALT 9  --   ALKPHOS 57  --   BILITOT 0.9  --    Cardiac Enzymes No results for input(s): "TROPONINI" in the last 168 hours. RADIOLOGY:  ECHOCARDIOGRAM COMPLETE Result Date: 12/11/2023    ECHOCARDIOGRAM REPORT   Patient Name:   Regina Ross Date of Exam: 12/11/2023 Medical Rec #:  [id number]     Height:       67.0 in Accession #:    [id number]    Weight:       177.2 lb Date of Birth:  Dec 16, 1935     BSA:          1.921 m Patient Age:    88 years      BP:           119/63 mmHg Patient Gender: F             HR:           75 bpm. Exam Location:  [facility] Procedure: 2D Echo, Cardiac Doppler and Color Doppler (Both Spectral and  Color            Flow Doppler were utilized during procedure). Indications:     Syncope R55  History:         Patient has no prior history of Echocardiogram examinations.  Sonographer:     Clenton Czech RDCS, FASE Referring Phys:  [id number] Lanetta Pion Diagnosing Phys: Lanell Pinta Custovic IMPRESSIONS  1. Left ventricular ejection fraction, by estimation, is 60 to 65%. The left ventricle has normal function. The left ventricle has no regional wall motion abnormalities. Left ventricular diastolic parameters are consistent with Grade I diastolic dysfunction (impaired relaxation).  2. Right ventricular systolic function is normal. The right ventricular size is normal. There is normal pulmonary artery systolic pressure. The estimated right ventricular systolic pressure is 33.9 mmHg.  3. The mitral valve is normal in structure. Trivial mitral valve regurgitation.  4. The aortic valve  is calcified. Aortic valve regurgitation is mild to moderate. Severe aortic valve stenosis. Aortic regurgitation PHT measures 414 msec. Aortic valve area, by VTI measures 0.78 cm. Aortic valve mean gradient measures 55.8 mmHg. Aortic valve Vmax measures 4.81 m/s.  5. The inferior vena cava is normal in size with greater than 50% respiratory variability, suggesting right atrial pressure of 3 mmHg. FINDINGS  Left Ventricle: Left ventricular ejection fraction, by estimation, is 60 to 65%. The left ventricle has normal function. The left ventricle has no regional wall motion abnormalities. The left ventricular internal cavity size was normal in size. There is  no left ventricular hypertrophy. Left ventricular diastolic parameters are consistent with Grade I diastolic dysfunction (impaired relaxation). Right Ventricle: The right ventricular size is normal. No increase in right ventricular wall thickness. Right ventricular systolic function is normal. There is normal pulmonary artery systolic pressure. The tricuspid regurgitant velocity is 2.78  m/s, and  with an assumed right atrial pressure of 3 mmHg, the estimated right ventricular systolic pressure is 33.9 mmHg. Left Atrium: Left atrial size was normal in size. Right Atrium: Right atrial size was normal in size. Pericardium: There is no evidence of pericardial effusion. Mitral Valve: The mitral valve is normal in structure. Trivial mitral valve regurgitation. Tricuspid Valve: The tricuspid valve is normal in structure. Tricuspid valve regurgitation is mild. Aortic Valve: The aortic valve is calcified. Aortic valve regurgitation is mild to moderate. Aortic regurgitation PHT measures 414 msec. Severe aortic stenosis is present. Aortic valve mean gradient measures 55.8 mmHg. Aortic valve peak gradient measures  92.4 mmHg. Aortic valve area, by VTI measures 0.78 cm. Pulmonic Valve: The pulmonic valve was normal in structure. Pulmonic valve regurgitation is not visualized. No evidence of pulmonic stenosis. Aorta: The aortic root is normal in size and structure. Venous: The inferior vena cava is normal in size with greater than 50% respiratory variability, suggesting right atrial pressure of 3 mmHg. IAS/Shunts: No atrial level shunt detected by color flow Doppler.  LEFT VENTRICLE PLAX 2D LVIDd:         4.20 cm   Diastology LVIDs:         2.90 cm   LV e' medial:    4.79 cm/s LV PW:         1.30 cm   LV E/e' medial:  10.9 LV IVS:        1.20 cm   LV e' lateral:   3.59 cm/s LVOT diam:     2.00 cm   LV E/e' lateral: 14.6 LV SV:         90 LV SV Index:   47 LVOT Area:     3.14 cm  RIGHT VENTRICLE RV Basal diam:  3.30 cm RV S prime:     14.00 cm/s TAPSE (M-mode): 2.2 cm LEFT ATRIUM           Index        RIGHT ATRIUM           Index LA diam:      3.40 cm 1.77 cm/m   RA Area:     10.60 cm LA Vol (A4C): 46.4 ml 24.15 ml/m  RA Volume:   24.90 ml  12.96 ml/m  AORTIC VALVE AV Area (Vmax):    0.84 cm AV Area (Vmean):   0.78 cm AV Area (VTI):     0.78 cm AV Vmax:           480.75 cm/s AV Vmean:  351.500  cm/s AV VTI:            1.160 m AV Peak Grad:      92.4 mmHg AV Mean Grad:      55.8 mmHg LVOT Vmax:         128.00 cm/s LVOT Vmean:        87.400 cm/s LVOT VTI:          0.288 m LVOT/AV VTI ratio: 0.25 AI PHT:            414 msec  AORTA Ao Root diam: 2.60 cm Ao Asc diam:  2.90 cm MITRAL VALVE                TRICUSPID VALVE MV Area (PHT): 2.91 cm     TR Peak grad:   30.9 mmHg MV Decel Time: 261 msec     TR Vmax:        278.00 cm/s MV E velocity: 52.40 cm/s MV A velocity: 125.00 cm/s  SHUNTS MV E/A ratio:  0.42         Systemic VTI:  0.29 m                             Systemic Diam: 2.00 cm Lanell Pinta Custovic Electronically signed by Isabell Manzanilla Signature Date/Time: 12/11/2023/2:28:32 PM    Final    CT KNEE LEFT WO CONTRAST Result Date: 12/10/2023 CLINICAL DATA:  Trauma EXAM: CT OF THE LEFT KNEE WITHOUT CONTRAST TECHNIQUE: Multidetector CT imaging of the left knee was performed according to the standard protocol. Multiplanar CT image reconstructions were also generated. RADIATION DOSE REDUCTION: This exam was performed according to the departmental dose-optimization program which includes automated exposure control, adjustment of the mA and/or kV according to patient size and/or use of iterative reconstruction technique. COMPARISON:  Left knee x-ray same day FINDINGS: Bones/Joint/Cartilage The bones are osteopenic. No focal osseous lesions are seen. There is no acute fracture or dislocation. There is tricompartmental joint space narrowing and osteophyte formation. This is most significant/severe in the lateral compartment. Moderate joint effusion present. Ligaments Suboptimally assessed by CT. Muscles and Tendons No intramuscular hematoma identified. There is edema and stranding in the region of the medial collateral ligament and medial patellar retinaculum. Soft tissues Peripheral vascular calcifications are present. IMPRESSION: 1. No acute fracture or dislocation. 2. Moderate joint effusion. 3. Edema and  stranding in the region of the medial collateral ligament and medial patellar retinaculum. Correlate clinically for injury. 4. Tricompartmental degenerative changes, most significant in the lateral compartment. Electronically Signed   By: Tyron Gallon M.D.   On: 12/10/2023 23:29   CT Knee Right Wo Contrast Result Date: 12/10/2023 CLINICAL DATA:  Knee injury, occult fracture suspected EXAM: CT OF THE RIGHT KNEE WITHOUT CONTRAST TECHNIQUE: Multidetector CT imaging of the right knee was performed according to the standard protocol. Multiplanar CT image reconstructions were also generated. RADIATION DOSE REDUCTION: This exam was performed according to the departmental dose-optimization program which includes automated exposure control, adjustment of the mA and/or kV according to patient size and/or use of iterative reconstruction technique. COMPARISON:  Plain films today FINDINGS: Bones/Joint/Cartilage Advanced tricompartment osteoarthritis in the right knee with severe joint space narrowing and spurring. Small so stated joint effusion. No fracture visualized. No subluxation or dislocation. Ligaments Suboptimally assessed by CT. Muscles and Tendons Negative Soft tissues Negative IMPRESSION: No acute bony abnormality. Severe osteoarthritis with small joint effusion. Electronically Signed   By: Ernestina Headland  Dover M.D.   On: 12/10/2023 23:27   DG HIP UNILAT WITH PELVIS 2-3 VIEWS RIGHT Result Date: 12/10/2023 CLINICAL DATA:  Fall EXAM: DG HIP (WITH OR WITHOUT PELVIS) 2-3V RIGHT COMPARISON:  None Available. FINDINGS: Prior left hip replacement. Advanced degenerative changes in the right hip. No acute bony abnormality. Specifically, no fracture, subluxation, or dislocation. IMPRESSION: No acute bony abnormality. Electronically Signed   By: Janeece Mechanic M.D.   On: 12/10/2023 19:30   DG Knee Complete 4 Views Left Result Date: 12/10/2023 CLINICAL DATA:  Left knee pain, swelling EXAM: LEFT KNEE - COMPLETE 4+ VIEW COMPARISON:   None Available. FINDINGS: Advanced tricompartment degenerative changes. No significant joint effusion. No acute bony abnormality. Specifically, no fracture, subluxation, or dislocation. IMPRESSION: Advanced degenerative changes.  No acute bony abnormality. Electronically Signed   By: Janeece Mechanic M.D.   On: 12/10/2023 19:29   CT Head Wo Contrast Result Date: 12/10/2023 CLINICAL DATA:  Mental status change, unknown cause.  Fall. EXAM: CT HEAD WITHOUT CONTRAST TECHNIQUE: Contiguous axial images were obtained from the base of the skull through the vertex without intravenous contrast. RADIATION DOSE REDUCTION: This exam was performed according to the departmental dose-optimization program which includes automated exposure control, adjustment of the mA and/or kV according to patient size and/or use of iterative reconstruction technique. COMPARISON:  11/09/2023 FINDINGS: Brain: There is atrophy and chronic small vessel disease changes. 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 No acute intracranial abnormality. Specifically, no hemorrhage, hydrocephalus, mass lesion, acute infarction, or significant intracranial injury. Vascular: No hyperdense vessel or unexpected calcification. Skull: No acute calvarial abnormality. Sinuses/Orbits: No acute findings Other: None IMPRESSION: Atrophy, chronic microvascular disease. No acute intracranial abnormality. Electronically Signed   By: Janeece Mechanic M.D.   On: 12/10/2023 19:29   DG Knee Complete 4 Views Right Result Date: 12/10/2023 CLINICAL DATA:  FALL EXAM: RIGHT KNEE - COMPLETE 4+ VIEW COMPARISON:  None Available. FINDINGS: THERE IS A SMALL JOINT EFFUSION. THERE IS NO ACUTE FRACTURE OR DISLOCATION IDENTIFIED. THERE IS SEVERE TRICOMPARTMENTAL OSTEOARTHROSIS WITH JOINT SPACE NARROWING AND OSTEOPHYTE FORMATION. IMPRESSION: 1. No acute fracture or dislocation. 2. Small joint effusion. 3. Severe tricompartmental osteoarthrosis. Electronically Signed    By: Tyron Gallon M.D.   On: 12/10/2023 16:59   DG Ankle Complete Left Result Date: 12/10/2023 CLINICAL DATA:  Fall and left ankle pain. EXAM: LEFT ANKLE COMPLETE - 3+ VIEW COMPARISON:  None Available. FINDINGS: There is no acute fracture or dislocation. The bones are osteopenic. The ankle mortise is intact. The soft tissues are unremarkable. IMPRESSION: 1. No acute fracture or dislocation. 2. Osteopenia. Electronically Signed   By: Angus Bark M.D.   On: 12/10/2023 16:54    Assessment and Plan Regina Ross is a 88 y.o. female with medical history significant for severe mitral regurgitation, aortic valve stenosis, HTN, HLD, frequent falls, OA bilateral knees, CKD llla, who presents to the ED for evaluation of a difficulty ambulating since a fall a week prior.  At baseline she ambulates with a walker and states that a week ago she was trying to put something in a drawer and lost her balance and fell.  Since then she has been essentially bedbound and unable to walk and now is having soreness on her buttocks that is prompting a decision to come to the emergency room to get checked out.   CT knee right: No acute fracture or dislocation. 2. Moderate joint effusion. 3. Edema and stranding in the region of the  medial collateral ligament and medial patellar retinaculum. Correlate clinically for injury. 4. Tricompartmental degenerative changes, most significant in the lateral compartment.  Aortic valve stenosis Chronic History of severe mitral regurgitation and aortic valve stenosis Fall, low suspicion for syncope--pt DID not pass out--her legs gave out --Head CT nonacute --Received IV fluid boluses in the ED --Echo shows severe AS, mild to mod AR  Right knee pain/Swelling with h/o falls --CT knee right knee effusion --Ortho consult with Dr Aberman--recommends cont anti-inflammatory, PT and f/u out pt.    Urinary tract infection --Urinalysis consistent with infection --UC multiple  species --IV Rocephin--change to Keflex   Elevated troponin --Troponin 47 but EKG nonacute and patient denies chest pain --Suspect demand ischemia   Ambulatory dysfunction History of a fall History of osteoarthritis --PT OT consulted   Decubitus ulcers, superficial --frequent turns --Foam padding   Anemia   HTN (hypertension) BP somewhat elevated Will resume atenolol and doxazosin  Hydralazine as needed for now   Procedures: Family communication :none today Consults :Ortho CODE STATUS: Full DVT Prophylaxis :lovenox Level of care: Telemetry Medical  TOC for d/c planning. Pt is medically best at baseline for d/c  TOTAL TIME TAKING CARE OF THIS PATIENT: 35 minutes.  >50% time spent on counselling and coordination of care  Note: This dictation was prepared with Dragon dictation along with smaller phrase technology. Any transcriptional errors that result from this process are unintentional.  Melvinia Stager M.D    [facility]   CC: Primary care physician; Sharyne Degree, FNP

## 2023-12-12 NOTE — TOC Initial Note (Signed)
 Transition of Care [facility]) - Initial/Assessment Note    Patient Details  Name: Regina Ross MRN: [id number] Date of Birth: 03-06-1936  Transition [facility] CM/SW Contact:    Crayton Docker, RN 12/12/2023, 11:44 AM  Clinical Narrative:                  CM to patient's room regarding TOC screening assessment. CM introduced case management role and discharge care planning process. Patient verbalized understanding and agreement with screening interview. Patient lives with nephew, Bambi Lever.   CM and patient discussed SNF recommendations. Patient states previous SNF and home health experience from broken hip. Per patient, no SNF preferences. CM will complete SNF workup.  Expected Discharge Plan: Skilled Nursing Facility Barriers to Discharge: Continued Medical Work up   Patient Goals and CMS Choice    SNF   Expected Discharge Plan and Services   Discharge Planning Services: CM Consult   Living arrangements for the past 2 months: Single Family Home                     Prior Living Arrangements/Services Living arrangements for the past 2 months: Single Family Home Lives with:: Other (Comment) Bambi Lever, nephew) Patient language and need for interpreter reviewed:: No        Need for Family Participation in Patient Care: Yes (Comment)   Current home services: DME (wheelchair, hospital bed, eclipse, bedside commode) Criminal Activity/Legal Involvement Pertinent to Current Situation/Hospitalization: No - Comment as needed  Activities of Daily Living   ADL Screening (condition at time of admission) Independently performs ADLs?: Yes (appropriate for developmental age) Is the patient deaf or have difficulty hearing?: No Does the patient have difficulty seeing, even when wearing glasses/contacts?: No Does the patient have difficulty concentrating, remembering, or making decisions?: No  Permission Sought/Granted Permission sought to share information with : Case Manager, Family  Supports Permission granted to share information with : Yes, Verbal Permission Granted  Share Information with NAME: MIchael Price     Permission granted to share info w Relationship: Nephew  Permission granted to share info w Contact Information: yes  Emotional Assessment Appearance:: Appears younger than stated age Attitude/Demeanor/Rapport: Engaged Affect (typically observed): Calm Orientation: : Oriented to Self, Oriented to Place, Oriented to  Time, Oriented to Situation Alcohol / Substance Use: Not Applicable Psych Involvement: No (comment)  Admission diagnosis:  Syncope and collapse [R55] Fall in home, initial encounter [W19.XXXA, Y92.009] Acute pain of both knees [M25.561, M25.562] Patient Active Problem List   Diagnosis Date Noted   HTN (hypertension) 12/11/2023   Syncope 12/11/2023   Ambulatory dysfunction 12/11/2023   Elevated troponin 12/11/2023   Urinary tract infection 12/11/2023   Severe mitral regurgitation 12/11/2023   Aortic valve stenosis 12/11/2023   Anemia 12/11/2023   Decubitus ulcers 12/11/2023   Fall at home 12/11/2023   Acute pain of both knees 12/11/2023   PCP:  Sharyne Degree, FNP Pharmacy:   [id number] [place], [place] - [address] AT [facility] OF [address] [place] [address] Phone: [phone] Fax: [phone]     Social Drivers of Health (SDOH) Social History: SDOH Screenings   Food Insecurity: No Food Insecurity (12/11/2023)  Housing: Low Risk  (12/11/2023)  Transportation Needs: No Transportation Needs (12/11/2023)  Utilities: Not At Risk (12/11/2023)  Social Connections: Socially Isolated (12/11/2023)  Tobacco Use: Low Risk  (12/10/2023)   SDOH Interventions:  Readmission Risk Interventions     No data to display

## 2023-12-12 NOTE — Plan of Care (Signed)
  Problem: Clinical Measurements: Goal: Ability to maintain clinical measurements within normal limits will improve Outcome: Progressing   Problem: Clinical Measurements: Goal: Will remain free from infection Outcome: Progressing   Problem: Clinical Measurements: Goal: Diagnostic test results will improve Outcome: Progressing   Problem: Activity: Goal: Risk for activity intolerance will decrease Outcome: Progressing   Problem: Pain Managment: Goal: General experience of comfort will improve and/or be controlled Outcome: Progressing   Problem: Safety: Goal: Ability to remain free from injury will improve Outcome: Progressing

## 2023-12-13 DIAGNOSIS — M170 Bilateral primary osteoarthritis of knee: Secondary | ICD-10-CM | POA: Diagnosis not present

## 2023-12-13 DIAGNOSIS — W19XXXA Unspecified fall, initial encounter: Secondary | ICD-10-CM | POA: Diagnosis not present

## 2023-12-13 DIAGNOSIS — R262 Difficulty in walking, not elsewhere classified: Secondary | ICD-10-CM | POA: Diagnosis not present

## 2023-12-13 DIAGNOSIS — Y92009 Unspecified place in unspecified non-institutional (private) residence as the place of occurrence of the external cause: Secondary | ICD-10-CM | POA: Diagnosis not present

## 2023-12-13 NOTE — Progress Notes (Signed)
 [facility]  - [place] at [facility]   PATIENT NAME: Regina Ross    MR#:  [id number]  DATE OF BIRTH:  Dec 20, 1935  SUBJECTIVE:  No family at bedside.  Feels better using Diclofenac  cream   VITALS:  Blood pressure (!) 131/54, pulse 63, temperature 98 F (36.7 C), temperature source Oral, resp. rate 16, height 5\' 7"  (1.702 m), weight 80.4 kg, SpO2 100%.  PHYSICAL EXAMINATION:   GENERAL:  88 y.o.-year-old patient with no acute distress. Morbidly obese LUNGS: Normal breath sounds bilaterally CARDIOVASCULAR: S1, S2 normal. ABDOMEN: Soft, nontender, nondistended.  EXTREMITIES: right knee tenderness. Decreased range of motion.    NEUROLOGIC: nonfocal  patient is alert and awake SKIN: superficial skin breakdown on the buttock.  LABORATORY PANEL:  CBC Recent Labs  Lab 12/10/23 1939  WBC 8.5  HGB 11.1*  HCT 34.4*  PLT 174    Chemistries  Recent Labs  Lab 12/10/23 1939 12/10/23 2114  NA 142  --   K 3.9  --   CL 107  --   CO2 25  --   GLUCOSE 108*  --   BUN 18  --   CREATININE 0.76  --   CALCIUM 8.9  --   MG  --  1.9  AST 17  --   ALT 9  --   ALKPHOS 57  --   BILITOT 0.9  --    Cardiac Enzymes No results for input(s): "TROPONINI" in the last 168 hours. RADIOLOGY:  No results found.   Assessment and Plan RASHAE Ross is a 88 y.o. female with medical history significant for severe mitral regurgitation, aortic valve stenosis, HTN, HLD, frequent falls, OA bilateral knees, CKD llla, who presents to the ED for evaluation of a difficulty ambulating since a fall a week prior.  At baseline she ambulates with a walker and states that a week ago she was trying to put something in a drawer and lost her balance and fell.  Since then she has been essentially bedbound and unable to walk and now is having soreness on her buttocks that is prompting a decision to come to the emergency room to get checked out.   CT knee right: No acute fracture or dislocation. 2.  Moderate joint effusion. 3. Edema and stranding in the region of the medial collateral ligament and medial patellar retinaculum. Correlate clinically for injury. 4. Tricompartmental degenerative changes, most significant in the lateral compartment.  Aortic valve stenosis Chronic History of severe mitral regurgitation and aortic valve stenosis Fall, low suspicion for syncope--pt DID not pass out--her legs gave out --Head CT nonacute --Received IV fluid boluses in the ED --Echo shows severe AS, mild to mod AR  Right knee pain/Swelling with h/o falls --CT knee right knee effusion --Ortho consult with Dr Aberman--recommends cont anti-inflammatory, PT and f/u out pt.    Urinary tract infection --Urinalysis consistent with infection --UC multiple species --IV Rocephin --change to Keflex    Elevated troponin --Troponin 47 but EKG nonacute and patient denies chest pain --Suspect demand ischemia   Ambulatory dysfunction History of a fall History of osteoarthritis --PT OT consulted   Decubitus ulcers, superficial --frequent turns --Foam padding   Anemia   HTN (hypertension) BP somewhat elevated Will resume atenolol  and doxazosin   Hydralazine  as needed for now   Procedures: Family communication :none today Consults :Ortho CODE STATUS: Full DVT Prophylaxis :lovenox  Level of care: Telemetry Medical  TOC for d/c planning. Pt is medically best at baseline for d/c  TOTAL  TIME TAKING CARE OF THIS PATIENT: 35 minutes.  >50% time spent on counselling and coordination of care  Note: This dictation was prepared with Dragon dictation along with smaller phrase technology. Any transcriptional errors that result from this process are unintentional.  Melvinia Stager M.D    [facility]   CC: Primary care physician; Sharyne Degree, FNP

## 2023-12-13 NOTE — Plan of Care (Signed)
  Problem: Clinical Measurements: Goal: Will remain free from infection Outcome: Progressing Goal: Respiratory complications will improve Outcome: Progressing   Problem: Elimination: Goal: Will not experience complications related to bowel motility Outcome: Progressing   Problem: Safety: Goal: Ability to remain free from injury will improve Outcome: Progressing

## 2023-12-13 NOTE — Progress Notes (Signed)
 Physical Therapy Treatment Patient Details Name: Regina Ross MRN: [id number] DOB: July 04, 1936 Today's Date: 12/13/2023   History of Present Illness Regina Ross is a 88 y.o. female with a history of hypertension, arthritis who comes the ED due to fall at home.    PT Comments  Pt is agreeable to session and able to improve tolerance for mobility efforts this date. Pt able to initiate B LEs, however still needs assist to complete task and sit at EOB. Practiced scooting up towards [facility], still has great difficulty. Will continue to progress as able. Educated pt to sit at EOB for meals and continue B LE HEP for B knees.    If plan is discharge home, recommend the following: Two people to help with walking and/or transfers;A lot of help with bathing/dressing/bathroom;Assist for transportation;Help with stairs or ramp for entrance   Can travel by private vehicle     No  Equipment Recommendations   (TBD)    Recommendations for Other Services       Precautions / Restrictions Precautions Precautions: Fall Recall of Precautions/Restrictions: Intact Restrictions Weight Bearing Restrictions Per Provider Order: No     Mobility  Bed Mobility Overal bed mobility: Needs Assistance Bed Mobility: Rolling, Supine to Sit, Sit to Supine Rolling: Min assist   Supine to sit: Mod assist Sit to supine: Max assist   General bed mobility comments: pt able to initiate mobility attempts with improved ability to utilize B LEs. Needs assist for trunkal elevation. Once seated at EOB, good static balance.    Transfers Overall transfer level: Needs assistance Equipment used: None               General transfer comment: several lateral scoots attempted at EOB with max/total assist. Heavy cues for sequencing. Not safe to lateral scoot towards recliner today    Ambulation/Gait               General Gait Details: unable   Stairs             Wheelchair Mobility     Tilt Bed     Modified Rankin (Stroke Patients Only)       Balance Overall balance assessment: Needs assistance Sitting-balance support: Feet supported Sitting balance-Leahy Scale: Good                                      Communication Communication Communication: No apparent difficulties  Cognition Arousal: Alert Behavior During Therapy: WFL for tasks assessed/performed   PT - Cognitive impairments: No apparent impairments                       PT - Cognition Comments: pleasant and agreeable to session Following commands: Intact      Cueing Cueing Techniques: Verbal cues, Tactile cues  Exercises Other Exercises Other Exercises: seated B LE performed including LAQ, hip add squeezes and AP. 10 reps with supervision. Other Exercises: assisted with repositioning to L side    General Comments        Pertinent Vitals/Pain Pain Assessment Pain Assessment: Faces Faces Pain Scale: Hurts a little bit Pain Location: L knee Pain Descriptors / Indicators: Aching Pain Intervention(s): Limited activity within patient's tolerance, Repositioned    Home Living                          Prior Function  PT Goals (current goals can now be found in the care plan section) Acute Rehab PT Goals Patient Stated Goal: to be more indep PT Goal Formulation: With patient Progress towards PT goals: Progressing toward goals    Frequency    Min 2X/week      PT Plan      Co-evaluation              AM-PAC PT "6 Clicks" Mobility   Outcome Measure  Help needed turning from your back to your side while in a flat bed without using bedrails?: A Lot Help needed moving from lying on your back to sitting on the side of a flat bed without using bedrails?: A Lot Help needed moving to and from a bed to a chair (including a wheelchair)?: Total Help needed standing up from a chair using your arms (e.g., wheelchair or bedside chair)?: Total Help needed  to walk in hospital room?: Total Help needed climbing 3-5 steps with a railing? : Total 6 Click Score: 8    End of Session   Activity Tolerance: Patient tolerated treatment well Patient left: in bed;with bed alarm set Nurse Communication: Mobility status PT Visit Diagnosis: Difficulty in walking, not elsewhere classified (R26.2);Other abnormalities of gait and mobility (R26.89)     Time: 1191-4782 PT Time Calculation (min) (ACUTE ONLY): 23 min  Charges:    $Therapeutic Exercise: 8-22 mins $Therapeutic Activity: 8-22 mins PT General Charges $$ ACUTE PT VISIT: 1 Visit                     Amparo Balk, PT, DPT, GCS [phone]    Shanyiah Conde 12/13/2023, 2:07 PM

## 2023-12-13 NOTE — Plan of Care (Signed)
  Problem: Education: Goal: Knowledge of condition and prescribed therapy will improve Outcome: Progressing   Problem: Physical Regulation: Goal: Complications related to the disease process, condition or treatment will be avoided or minimized Outcome: Progressing   Problem: Health Behavior/Discharge Planning: Goal: Ability to manage health-related needs will improve Outcome: Progressing

## 2023-12-13 NOTE — Progress Notes (Signed)
 OT Cancellation Note  Patient Details Name: SHATIA SINDONI MRN: [id number] DOB: October 24, 1935   Cancelled Treatment:    Reason Eval/Treat Not Completed: Pain limiting ability to participate. Pt declining participation in OT session, 6/10 pain RLE. RN notified. OT will re-attempt as able.   Kallie Depolo L. Treyvon Blahut, OTR/L  12/13/23, 3:10 PM

## 2023-12-13 NOTE — NC FL2 (Signed)
 [place]  MEDICAID FL2 LEVEL OF CARE FORM     IDENTIFICATION  Patient Name: Regina Ross Birthdate: 1935/09/19 Sex: female Admission Date (Current Location): 12/10/2023  [facility] and [place] Number:  Chiropodist and Address:  [facility], [address], [place], [place] [address]      Provider Number: [id number]  Attending Physician Name and Address:  Melvinia Stager, MD  Relative Name and Phone Number:  Nephew: Marina Shy, [phone]    Current Level of Care: Hospital Recommended Level of Care: Skilled Nursing Facility Prior Approval Number:    Date Approved/Denied:   PASRR Number: [id number]  Discharge Plan: SNF    Current Diagnoses: Patient Active Problem List   Diagnosis Date Noted   Osteoarthritis of right hip 12/12/2023   Osteoarthritis of both knees 12/12/2023   HTN (hypertension) 12/11/2023   Syncope 12/11/2023   Ambulatory dysfunction 12/11/2023   Elevated troponin 12/11/2023   Urinary tract infection 12/11/2023   Severe mitral regurgitation 12/11/2023   Aortic valve stenosis 12/11/2023   Anemia 12/11/2023   Decubitus ulcers 12/11/2023   Fall at home 12/11/2023   Acute pain of both knees 12/11/2023    Orientation RESPIRATION BLADDER Height & Weight     Self, Time, Situation, Place  Normal Incontinent Weight: 80.4 kg Height:  5\' 7"  (170.2 cm)  BEHAVIORAL SYMPTOMS/MOOD NEUROLOGICAL BOWEL NUTRITION STATUS      Incontinent Diet (Heart Healthy, thin liquid)  AMBULATORY STATUS COMMUNICATION OF NEEDS Skin   Limited Assist Verbally PU Stage and Appropriate Care     PU Stage 3 Dressing: BID                 Personal Care Assistance Level of Assistance  Bathing, Feeding, Dressing Bathing Assistance: Limited assistance Feeding assistance: Limited assistance Dressing Assistance: Limited assistance     Functional Limitations Info             SPECIAL CARE FACTORS FREQUENCY  PT (By licensed PT), OT (By  licensed OT)     PT Frequency: 5 times per week OT Frequency: 5 times per week            Contractures Contractures Info: Not present    Additional Factors Info  Code Status, Allergies Code Status Info: Full Code Allergies Info: NKDA           Current Medications (12/13/2023):  This is the current hospital active medication list Current Facility-Administered Medications  Medication Dose Route Frequency Provider Last Rate Last Admin   acetaminophen  (TYLENOL ) tablet 650 mg  650 mg Oral Q6H PRN Duncan, Hazel V, MD   650 mg at 12/12/23 2055   Or   acetaminophen  (TYLENOL ) suppository 650 mg  650 mg Rectal Q6H PRN Lanetta Pion, MD       aspirin  EC tablet 81 mg  81 mg Oral Daily Patel, Sona, MD   81 mg at 12/13/23 4401   atenolol  (TENORMIN ) tablet 50 mg  50 mg Oral Daily Patel, Sona, MD   50 mg at 12/13/23 0272   cephALEXin  (KEFLEX ) capsule 500 mg  500 mg Oral TID Patel, Sona, MD   500 mg at 12/13/23 5366   diclofenac  Sodium (VOLTAREN ) 1 % topical gel 2 g  2 g Topical TID Patel, Sona, MD   2 g at 12/13/23 4403   doxazosin  (CARDURA ) tablet 4 mg  4 mg Oral Daily Patel, Sona, MD   4 mg at 12/13/23 4742   enoxaparin  (LOVENOX ) injection 40 mg  40 mg Subcutaneous Q24H Duncan, Hazel V, MD   40 mg at 12/13/23 1610   hydrALAZINE  (APRESOLINE ) injection 5 mg  5 mg Intravenous Q4H PRN Duncan, Hazel V, MD   5 mg at 12/11/23 9604   meclizine  (ANTIVERT ) tablet 25 mg  25 mg Oral Daily Duncan, Hazel V, MD   25 mg at 12/13/23 5409   ondansetron  (ZOFRAN ) tablet 4 mg  4 mg Oral Q6H PRN Duncan, Hazel V, MD       Or   ondansetron  (ZOFRAN ) injection 4 mg  4 mg Intravenous Q6H PRN Duncan, Hazel V, MD       oxyCODONE  (Oxy IR/ROXICODONE ) immediate release tablet 5 mg  5 mg Oral Q4H PRN Duncan, Hazel V, MD   5 mg at 12/12/23 2055   polyvinyl alcohol  (LIQUIFILM TEARS) 1.4 % ophthalmic solution 1 drop  1 drop Both Eyes PRN Patel, Sona, MD       simvastatin  (ZOCOR ) tablet 20 mg  20 mg Oral QHS Duncan, Hazel V,  MD   20 mg at 12/12/23 2147   sodium chloride  flush (NS) 0.9 % injection 3 mL  3 mL Intravenous Q12H Lanetta Pion, MD   3 mL at 12/13/23 8119     Discharge Medications: Please see discharge summary for a list of discharge medications.  Relevant Imaging Results:  Relevant Lab Results:   Additional Information SSN: [id number]  Alexandra Ice, RN

## 2023-12-13 NOTE — TOC Progression Note (Signed)
 Transition of Care [facility]) - Progression Note    Patient Details  Name: Regina Ross MRN: [id number] Date of Birth: July 01, 1936  Transition of Care [facility]) CM/SW Contact  Alexandra Ice, RN Phone Number: 12/13/2023, 10:45 AM  Clinical Narrative:     [place] [facility], and bedsearch completed. Pending bed offers.  Expected Discharge Plan: Skilled Nursing Facility Barriers to Discharge: Continued Medical Work up  Expected Discharge Plan and Services   Discharge Planning Services: CM Consult   Living arrangements for the past 2 months: Single Family Home                                       Social Determinants of Health (SDOH) Interventions SDOH Screenings   Food Insecurity: No Food Insecurity (12/11/2023)  Housing: Low Risk  (12/11/2023)  Transportation Needs: No Transportation Needs (12/11/2023)  Utilities: Not At Risk (12/11/2023)  Social Connections: Socially Isolated (12/11/2023)  Tobacco Use: Low Risk  (12/10/2023)    Readmission Risk Interventions     No data to display

## 2023-12-14 DIAGNOSIS — Y92009 Unspecified place in unspecified non-institutional (private) residence as the place of occurrence of the external cause: Secondary | ICD-10-CM | POA: Diagnosis not present

## 2023-12-14 DIAGNOSIS — M170 Bilateral primary osteoarthritis of knee: Secondary | ICD-10-CM | POA: Diagnosis not present

## 2023-12-14 DIAGNOSIS — W19XXXA Unspecified fall, initial encounter: Secondary | ICD-10-CM | POA: Diagnosis not present

## 2023-12-14 DIAGNOSIS — R262 Difficulty in walking, not elsewhere classified: Secondary | ICD-10-CM | POA: Diagnosis not present

## 2023-12-14 LAB — GLUCOSE, CAPILLARY: Glucose-Capillary: 113 mg/dL — ABNORMAL HIGH (ref 70–99)

## 2023-12-14 NOTE — Plan of Care (Signed)
   Problem: Education: Goal: Knowledge of condition and prescribed therapy will improve Outcome: Progressing   Problem: Cardiac: Goal: Will achieve and/or maintain adequate cardiac output Outcome: Progressing

## 2023-12-14 NOTE — TOC Progression Note (Signed)
 Transition of Care [facility]) - Progression Note    Patient Details  Name: Regina Ross MRN: [id number] Date of Birth: Mar 13, 1936  Transition of Care [facility]) CM/SW Contact  Alexandra Ice, RN Phone Number: 12/14/2023, 8:59 AM  Clinical Narrative:    Met with patient at bedside, to discuss SNF options. She choice [facility].    Expected Discharge Plan: Skilled Nursing Facility Barriers to Discharge: Continued Medical Work up  Expected Discharge Plan and Services   Discharge Planning Services: CM Consult   Living arrangements for the past 2 months: Single Family Home                                       Social Determinants of Health (SDOH) Interventions SDOH Screenings   Food Insecurity: No Food Insecurity (12/11/2023)  Housing: Low Risk  (12/11/2023)  Transportation Needs: No Transportation Needs (12/11/2023)  Utilities: Not At Risk (12/11/2023)  Social Connections: Socially Isolated (12/11/2023)  Tobacco Use: Low Risk  (12/10/2023)    Readmission Risk Interventions     No data to display

## 2023-12-14 NOTE — TOC Progression Note (Signed)
 Transition of Care [facility]) - Progression Note    Patient Details  Name: Regina Ross MRN: [id number] Date of Birth: June 17, 1936  Transition of Care [facility]) CM/SW Contact  Alexandra Ice, RN Phone Number: 12/14/2023, 12:22 PM  Clinical Narrative:     Patient pending Siri Duet: [id number], for [facility]   Expected Discharge Plan: Skilled Nursing Facility Barriers to Discharge: Continued Medical Work up  Expected Discharge Plan and Services   Discharge Planning Services: CM Consult   Living arrangements for the past 2 months: Single Family Home                                       Social Determinants of Health (SDOH) Interventions SDOH Screenings   Food Insecurity: No Food Insecurity (12/11/2023)  Housing: Low Risk  (12/11/2023)  Transportation Needs: No Transportation Needs (12/11/2023)  Utilities: Not At Risk (12/11/2023)  Social Connections: Socially Isolated (12/11/2023)  Tobacco Use: Low Risk  (12/10/2023)    Readmission Risk Interventions     No data to display

## 2023-12-14 NOTE — Plan of Care (Signed)
   Problem: Nutrition: Goal: Adequate nutrition will be maintained Outcome: Progressing   Problem: Coping: Goal: Level of anxiety will decrease Outcome: Progressing   Problem: Pain Managment: Goal: General experience of comfort will improve and/or be controlled Outcome: Progressing

## 2023-12-14 NOTE — Progress Notes (Signed)
 [facility]  - [place] [facility]   PATIENT NAME: Regina Ross    MR#:  [id number]  DATE OF BIRTH:  27-Feb-1936  SUBJECTIVE:  No family at bedside.  Feels better using Diclofenac  cream   VITALS:  Blood pressure (!) 145/61, pulse 77, temperature 98 F (36.7 C), resp. rate 16, height 5\' 7"  (1.702 m), weight 81.2 kg, SpO2 100%.  PHYSICAL EXAMINATION:   GENERAL:  88 y.o.-year-old patient with no acute distress. Morbidly obese LUNGS: Normal breath sounds bilaterally CARDIOVASCULAR: S1, S2 normal. ABDOMEN: Soft, nontender, nondistended.  EXTREMITIES: right knee tenderness. Decreased range of motion.    NEUROLOGIC: nonfocal  patient is alert and awake SKIN: superficial skin breakdown on the buttock.  LABORATORY PANEL:  CBC Recent Labs  Lab 12/10/23 1939  WBC 8.5  HGB 11.1*  HCT 34.4*  PLT 174    Chemistries  Recent Labs  Lab 12/10/23 1939 12/10/23 2114  NA 142  --   K 3.9  --   CL 107  --   CO2 25  --   GLUCOSE 108*  --   BUN 18  --   CREATININE 0.76  --   CALCIUM 8.9  --   MG  --  1.9  AST 17  --   ALT 9  --   ALKPHOS 57  --   BILITOT 0.9  --    Cardiac Enzymes No results for input(s): "TROPONINI" in the last 168 hours. RADIOLOGY:  No results found.   Assessment and Plan Regina Ross is a 88 y.o. female with medical history significant for severe mitral regurgitation, aortic valve stenosis, HTN, HLD, frequent falls, OA bilateral knees, CKD llla, who presents to the ED for evaluation of a difficulty ambulating since a fall a week prior.  At baseline she ambulates with a walker and states that a week ago she was trying to put something in a drawer and lost her balance and fell.  Since then she has been essentially bedbound and unable to walk and now is having soreness on her buttocks that is prompting a decision to come to the emergency room to get checked out.   CT knee right: No acute fracture or dislocation. 2. Moderate joint  effusion. 3. Edema and stranding in the region of the medial collateral ligament and medial patellar retinaculum. Correlate clinically for injury. 4. Tricompartmental degenerative changes, most significant in the lateral compartment.  Aortic valve stenosis Chronic History of severe mitral regurgitation and aortic valve stenosis Fall, low suspicion for syncope--pt DID not pass out--her legs gave out --Head CT nonacute --Received IV fluid boluses in the ED --Echo shows severe AS, mild to mod AR  Right knee pain/Swelling with h/o falls --CT knee right knee effusion --Ortho consult with Dr Aberman--recommends cont anti-inflammatory, PT and f/u out pt.    Urinary tract infection --Urinalysis consistent with infection --UC multiple species --IV Rocephin --change to Keflex    Elevated troponin --Troponin 47 but EKG nonacute and patient denies chest pain --Suspect demand ischemia   Ambulatory dysfunction History of a fall History of osteoarthritis --PT OT consulted   Decubitus ulcers, superficial --frequent turns --Foam padding   Anemia   HTN (hypertension) BP somewhat elevated Will resume atenolol  and doxazosin   Hydralazine  as needed for now   Procedures: Family communication :none today Consults :Ortho CODE STATUS: Full DVT Prophylaxis :lovenox  Level of care: Telemetry Medical  TOC for d/c planning. Pt is medically best at baseline for d/c. Awaitng insurance auth  TOTAL  TIME TAKING CARE OF THIS PATIENT: 35 minutes.  >50% time spent on counselling and coordination of care  Note: This dictation was prepared with Dragon dictation along with smaller phrase technology. Any transcriptional errors that result from this process are unintentional.  Melvinia Stager M.D    [facility]   CC: Primary care physician; Sharyne Degree, FNP

## 2023-12-15 DIAGNOSIS — I10 Essential (primary) hypertension: Secondary | ICD-10-CM

## 2023-12-15 DIAGNOSIS — I340 Nonrheumatic mitral (valve) insufficiency: Secondary | ICD-10-CM | POA: Diagnosis not present

## 2023-12-15 DIAGNOSIS — N3000 Acute cystitis without hematuria: Secondary | ICD-10-CM | POA: Diagnosis not present

## 2023-12-15 DIAGNOSIS — I350 Nonrheumatic aortic (valve) stenosis: Secondary | ICD-10-CM | POA: Diagnosis not present

## 2023-12-15 DIAGNOSIS — I5A Non-ischemic myocardial injury (non-traumatic): Secondary | ICD-10-CM

## 2023-12-15 DIAGNOSIS — M25561 Pain in right knee: Secondary | ICD-10-CM | POA: Diagnosis not present

## 2023-12-15 DIAGNOSIS — D508 Other iron deficiency anemias: Secondary | ICD-10-CM

## 2023-12-15 DIAGNOSIS — D509 Iron deficiency anemia, unspecified: Secondary | ICD-10-CM

## 2023-12-15 LAB — GLUCOSE, CAPILLARY: Glucose-Capillary: 93 mg/dL (ref 70–99)

## 2023-12-15 MED ORDER — DICLOFENAC SODIUM 1 % EX GEL: CUTANEOUS | 0 refills | Status: AC

## 2023-12-15 MED ORDER — METHYLPREDNISOLONE SODIUM SUCC 40 MG IJ SOLR
40.0000 mg | Freq: Every day | INTRAMUSCULAR | Status: DC
  Administered 2023-12-15 – 2023-12-16 (×2): 40 mg via INTRAVENOUS
  Filled 2023-12-15 (×2): qty 1

## 2023-12-15 MED ORDER — ACETAMINOPHEN 325 MG PO TABS: 650.0000 mg | ORAL_TABLET | Freq: Four times a day (QID) | ORAL | Status: AC | PRN

## 2023-12-15 MED ORDER — OXYCODONE HCL 5 MG PO TABS: 5.0000 mg | ORAL_TABLET | Freq: Four times a day (QID) | ORAL | 0 refills | Status: AC | PRN

## 2023-12-15 MED ORDER — KETOROLAC TROMETHAMINE 15 MG/ML IJ SOLN
15.0000 mg | Freq: Once | INTRAMUSCULAR | Status: AC
  Administered 2023-12-15: 15 mg via INTRAVENOUS
  Filled 2023-12-15: qty 1

## 2023-12-15 NOTE — Plan of Care (Signed)

## 2023-12-15 NOTE — TOC Progression Note (Signed)
 Transition of Care [facility]) - Progression Note    Patient Details  Name: Regina Ross MRN: [id number] Date of Birth: 1935-12-17  [facility]) CM/SW Contact  Alexandra Ice, RN Phone Number: 12/15/2023, 9:04 AM  Clinical Narrative:     Siegfried Dress still pending for [facility].  Expected Discharge Plan: Skilled Nursing Facility Barriers to Discharge: Continued Medical Work up  Expected Discharge Plan and Services   Discharge Planning Services: CM Consult   Living arrangements for the past 2 months: Single Family Home                                       Social Determinants of Health (SDOH) Interventions SDOH Screenings   Food Insecurity: No Food Insecurity (12/11/2023)  Housing: Low Risk  (12/11/2023)  Transportation Needs: No Transportation Needs (12/11/2023)  Utilities: Not At Risk (12/11/2023)  Social Connections: Socially Isolated (12/11/2023)  Tobacco Use: Low Risk  (12/10/2023)    Readmission Risk Interventions     No data to display

## 2023-12-15 NOTE — TOC Progression Note (Signed)
 Transition of Care [facility]) - Progression Note    Patient Details  Name: Regina Ross MRN: [id number] Date of Birth: 08-15-1935  Transition [facility] Care [facility]) CM/SW Contact  Alexandra Ice, RN Phone Number: 12/15/2023, 1:47 PM  Clinical Narrative:    Patient received Siegfried Dress approval for [facility], PlanAuthID:[id number] Dates:5/21-5/23/25 Next REview Date: 12/17/2023. Sent message to Ivette Marks at [facility], to see if they are able to accept today, awaiting response.   Expected Discharge Plan: Skilled Nursing Facility Barriers to Discharge: Continued Medical Work up  Expected Discharge Plan and Services   Discharge Planning Services: CM Consult   Living arrangements for the past 2 months: Single Family Home                                       Social Determinants of Health (SDOH) Interventions SDOH Screenings   Food Insecurity: No Food Insecurity (12/11/2023)  Housing: Low Risk  (12/11/2023)  Transportation Needs: No Transportation Needs (12/11/2023)  Utilities: Not At Risk (12/11/2023)  Social Connections: Socially Isolated (12/11/2023)  Tobacco Use: Low Risk  (12/10/2023)    Readmission Risk Interventions     No data to display

## 2023-12-15 NOTE — TOC Progression Note (Signed)
 Transition of Care [facility]) - Progression Note    Patient Details  Name: Regina Ross MRN: [id number] Date of Birth: 02-Apr-1936  Transition of Care [facility]) CM/SW Contact  Alexandra Ice, RN Phone Number: 12/15/2023, 3:06 PM  Clinical Narrative:    Received call from Fab[facility] Holster [facility], stated unable to accept patient today, they have no beds, will follow up tomorrow.   Expected Discharge Plan: Skilled Nursing Facility Barriers to Discharge: Continued Medical Work up  Expected Discharge Plan and Services   Discharge Planning Services: CM Consult   Living arrangements for the past 2 months: Single Family Home                                       Social Determinants of Health (SDOH) Interventions SDOH Screenings   Food Insecurity: No Food Insecurity (12/11/2023)  Housing: Low Risk  (12/11/2023)  Transportation Needs: No Transportation Needs (12/11/2023)  Utilities: Not At Risk (12/11/2023)  Social Connections: Socially Isolated (12/11/2023)  Tobacco Use: Low Risk  (12/10/2023)    Readmission Risk Interventions     No data to display

## 2023-12-15 NOTE — Progress Notes (Signed)
 Progress Note   Patient: Regina Ross:[id number] DOB: 10-06-1935 DOA: 12/10/2023     0 DOS: the patient was seen and examined on 12/15/2023   Brief hospital course: 88 y.o. female with medical history significant for severe mitral regurgitation, aortic valve stenosis, HTN, HLD, frequent falls, OA bilateral knees, CKD llla, who presents to the ED for evaluation of a difficulty ambulating since a fall a week prior.  At baseline she ambulates with a walker and states that a week ago she was trying to put something in a drawer and lost her balance and fell.  Since then she has been essentially bedbound and unable to walk and now is having soreness on her buttocks that is prompting a decision to come to the emergency room to get checked out.    CT knee right: No acute fracture or dislocation. 2. Moderate joint effusion. 3. Edema and stranding in the region of the medial collateral ligament and medial patellar retinaculum. Correlate clinically for injury. 4. Tricompartmental degenerative changes, most significant in the lateral compartment.  5/21.  Received insurance authorization for rehab at facility does not have a bed today.  Patient complained of knee pain.  Ice pack, diclofenac  gel.  1 dose of Toradol.  Will give a dose of Solu-Medrol and see if she responds.  Follow-up with orthopedic as outpatient.  Assessment and Plan: Right knee pain Seen by orthopedic surgery recommended follow-up as outpatient.  Patient on oxycodone  and diclofenac  gel.  Will give 1 dose of Toradol and will give systemic steroid to see if this helps.  Check a uric acid tomorrow morning.  Aortic valve stenosis History of severe mitral regurgitation and aortic valve stenosis Fall, low suspicion for syncope Follow-up with cardiology as outpatient.  Urinary tract infection Patient will complete Keflex  this evening.  Ambulatory dysfunction History of a fall History of osteoarthritis Physical therapy recommending  rehab  Decubitus ulcers Present on admission, see full description below.  Numerous decubiti.  HTN (hypertension) Patient on atenolol  and doxazosin .  Iron deficiency anemia Last hemoglobin 11.1  Myocardial injury Troponin only borderline elevated        Subjective: Patient seen this morning complains of not being able to move around very much.  This afternoon having more right knee pain.  Just had a pain pill.  Will give a dose of Toradol and a dose of Solu-Medrol.  Physical Exam: Vitals:   12/15/23 0500 12/15/23 0826 12/15/23 1501 12/15/23 1644  BP:  (!) 127/46 121/61 (!) 101/46  Pulse:  66 68 63  Resp:  18 18 16   Temp:  (!) 97.5 F (36.4 C) 97.8 F (36.6 C) 98.7 F (37.1 C)  TempSrc:  Oral    SpO2:  98% 99% 100%  Weight: 82.4 kg     Height:       Physical Exam HENT:     Head: Normocephalic.     Mouth/Throat:     Pharynx: No oropharyngeal exudate.  Eyes:     General: Lids are normal.     Conjunctiva/sclera: Conjunctivae normal.  Cardiovascular:     Rate and Rhythm: Normal rate and regular rhythm.     Heart sounds: S1 normal and S2 normal. Murmur heard.     Systolic murmur is present with a grade of 4/6.  Pulmonary:     Breath sounds: No decreased breath sounds, wheezing, rhonchi or rales.  Abdominal:     Palpations: Abdomen is soft.     Tenderness: There is no abdominal tenderness.  Musculoskeletal:     Right knee: Swelling present. Decreased range of motion.     Right lower leg: No swelling.     Left lower leg: No swelling.  Skin:    General: Skin is warm.  Neurological:     Mental Status: She is alert and oriented to person, place, and time.     Comments: Unable to straight leg raise with right leg.     Data Reviewed: Vitamin B12 318, ferritin 73, magnesium 1.9, creatinine 0.76 TSH 0.385, hemoglobin 11.1, white blood count 8.5, procalcitonin negative  Family Communication: Tried to reach contact in computer but unable  Disposition: Status is:  Observation We received insurance authorization for rehab but facility did not have a bed available for today.  Planned Discharge Destination: Rehab    Time spent: 30 minutes  Author: Verla Glaze, MD 12/15/2023 4:45 PM  For on call review [email or web address].

## 2023-12-15 NOTE — Progress Notes (Signed)
 Physical Therapy Treatment Patient Details Name: Regina Ross MRN: [id number] DOB: Jan 18, 1936 Today's Date: 12/15/2023   History of Present Illness Regina Ross is a 88 y.o. female with a history of hypertension, arthritis who comes the ED due to fall at home.    PT Comments  PT/OT co-treatment performed overlapping for mobility session. Pt premedicated prior to session and able to transition to EOB with 1 assist. Pt then able to trial 10 reps of sit<>Stand using STEDY with partial lift off noted. Great effort and tolerance demonstrating progress towards goals. Still limited by R knee pain with mobility. Will continue to progress.    If plan is discharge home, recommend the following: Two people to help with walking and/or transfers;A lot of help with bathing/dressing/bathroom;Assist for transportation;Help with stairs or ramp for entrance   Can travel by private vehicle     No  Equipment Recommendations  None recommended by PT    Recommendations for Other Services       Precautions / Restrictions Precautions Precautions: Fall Recall of Precautions/Restrictions: Intact Restrictions Weight Bearing Restrictions Per Provider Order: No Other Position/Activity Restrictions: WBAT     Mobility  Bed Mobility Overal bed mobility: Needs Assistance Bed Mobility: Supine to Sit     Supine to sit: Mod assist     General bed mobility comments: needs increased time for movement initiation. Needs assist for B LEs and cue for sequencing. Once seated, able to sit with supervision    Transfers Overall transfer level: Needs assistance Equipment used: Ambulation equipment used Transfers: Sit to/from Stand Sit to Stand: Max assist, +2 physical assistance, +2 safety/equipment, From elevated surface, Via lift equipment           General transfer comment: 10 total reps with therapeduic rest breaks as needed. Heavy cues for sequencing. Bed extremely elevated and pt able to perform partial  squats raising ~3-4" off bed only able to tolerate for a few seconds. Improved with rocking momentum Transfer via Lift Equipment: Stedy  Ambulation/Gait               General Gait Details: unable   Optometrist     Tilt Bed    Modified Rankin (Stroke Patients Only)       Balance Overall balance assessment: Needs assistance Sitting-balance support: Feet supported Sitting balance-Leahy Scale: Good                                      Communication Communication Communication: No apparent difficulties  Cognition Arousal: Alert Behavior During Therapy: WFL for tasks assessed/performed   PT - Cognitive impairments: No apparent impairments                       PT - Cognition Comments: pleasant and agreeable to session Following commands: Intact      Cueing Cueing Techniques: Verbal cues, Tactile cues  Exercises Other Exercises Other Exercises: seated B LE ther-ex including alt marching, LAQ, and AP. Also performed B UE triceps presses to simulate standing transfer. 10 reps performed with CGA    General Comments        Pertinent Vitals/Pain Pain Assessment Pain Assessment: Faces Faces Pain Scale: Hurts little more Pain Location: R knee Pain Descriptors / Indicators: Aching Pain Intervention(s): Limited activity within patient's tolerance, Repositioned, RN gave pain  meds during session    Home Living                          Prior Function            PT Goals (current goals can now be found in the care plan section) Acute Rehab PT Goals Patient Stated Goal: to be more indep PT Goal Formulation: With patient Progress towards PT goals: Progressing toward goals    Frequency    Min 2X/week      PT Plan      Co-evaluation PT/OT/SLP Co-Evaluation/Treatment: Yes Reason for Co-Treatment: Complexity of the patient's impairments (multi-system involvement);For patient/therapist  safety;To address functional/ADL transfers PT goals addressed during session: Mobility/safety with mobility OT goals addressed during session: ADL's and self-care      AM-PAC PT "6 Clicks" Mobility   Outcome Measure  Help needed turning from your back to your side while in a flat bed without using bedrails?: A Lot Help needed moving from lying on your back to sitting on the side of a flat bed without using bedrails?: A Lot Help needed moving to and from a bed to a chair (including a wheelchair)?: Total Help needed standing up from a chair using your arms (e.g., wheelchair or bedside chair)?: Total Help needed to walk in hospital room?: Total Help needed climbing 3-5 steps with a railing? : Total 6 Click Score: 8    End of Session Equipment Utilized During Treatment: Gait belt Activity Tolerance: Patient tolerated treatment well Patient left:  (seated on EOB with OT present) Nurse Communication: Mobility status PT Visit Diagnosis: Difficulty in walking, not elsewhere classified (R26.2);Other abnormalities of gait and mobility (R26.89)     Time: 8657-8469 PT Time Calculation (min) (ACUTE ONLY): 27 min  Charges:    $Therapeutic Activity: 8-22 mins PT General Charges $$ ACUTE PT VISIT: 1 Visit                     Amparo Balk, PT, DPT, GCS [phone]    Abbe Bula 12/15/2023, 11:17 AM

## 2023-12-15 NOTE — Assessment & Plan Note (Addendum)
 Seen by orthopedic surgery recommended follow-up as outpatient.  Patient on oxycodone  and diclofenac  gel.  Pain improved with Solu-Medrol.  Will give a prednisone taper upon going out to rehab.  Continue working with physical therapy.

## 2023-12-15 NOTE — Assessment & Plan Note (Signed)
 Troponin only borderline elevated

## 2023-12-15 NOTE — Hospital Course (Addendum)
 88 y.o. female with medical history significant for severe mitral regurgitation, aortic valve stenosis, HTN, HLD, frequent falls, OA bilateral knees, CKD llla, who presents to the ED for evaluation of a difficulty ambulating since a fall a week prior.  At baseline she ambulates with a walker and states that a week ago she was trying to put something in a drawer and lost her balance and fell.  Since then she has been essentially bedbound and unable to walk and now is having soreness on her buttocks that is prompting a decision to come to the emergency room to get checked out.    CT knee right: No acute fracture or dislocation. 2. Moderate joint effusion. 3. Edema and stranding in the region of the medial collateral ligament and medial patellar retinaculum. Correlate clinically for injury. 4. Tricompartmental degenerative changes, most significant in the lateral compartment.  5/21.  Received insurance authorization for rehab at facility does not have a bed today.  Patient complained of knee pain.  Ice pack, diclofenac  gel.  1 dose of Toradol.  Will give a dose of Solu-Medrol and see if she responds.  Follow-up with orthopedic as outpatient. 5/22.  This morning's labs showed a hemoglobin of 9.5 and a potassium of 5.2.  Patient given Lokelma.  Repeat potassium 4.4 and repeat hemoglobin 10.0.  Stable for discharge to rehab.  Since pain better with Solu-Medrol will give a prednisone taper.

## 2023-12-15 NOTE — Progress Notes (Signed)
 Occupational Therapy Treatment Patient Details Name: Regina Ross MRN: [id number] DOB: 1935/11/26 Today's Date: 12/15/2023   History of present illness Regina Ross is a 88 y.o. female with a history of hypertension, arthritis who comes the ED due to fall at home.   OT comments  Ms Manganello was seen for OT treatment on this date. Upon arrival to room pt seated EOB with PT, agreeable to tx. Pt requires MAX A x2 + STEDY for x10 sit<>stands from max elevated bed height, clears rear ~1-4 inches. MOD A don/doff gown in sitting. MAX A x2 lateral scoot along bed and sit>sup. Pt making good progress toward goals, will continue to follow POC. Discharge recommendation remains appropriate.       If plan is discharge home, recommend the following:  Two people to help with walking and/or transfers;A lot of help with bathing/dressing/bathroom;Two people to help with bathing/dressing/bathroom;Assistance with cooking/housework;Assist for transportation;Help with stairs or ramp for entrance   Equipment Recommendations  Other (comment)    Recommendations for Other Services      Precautions / Restrictions Precautions Precautions: Fall Recall of Precautions/Restrictions: Intact Restrictions Weight Bearing Restrictions Per Provider Order: Yes Other Position/Activity Restrictions: WBAT       Mobility Bed Mobility Overal bed mobility: Needs Assistance Bed Mobility: Sit to Supine       Sit to supine: Max assist, +2 for physical assistance        Transfers Overall transfer level: Needs assistance Equipment used: Ambulation equipment used Transfers: Sit to/from Stand, Bed to chair/wheelchair/BSC Sit to Stand: Max assist, +2 physical assistance, From elevated surface, Via lift equipment          Lateral/Scoot Transfers: Max assist, +2 physical assistance General transfer comment: x10 stands from max elevated bed height, clears rear ~1-4 inches each trial Transfer via Lift Equipment: Stedy    Balance Overall balance assessment: Needs assistance Sitting-balance support: Feet supported Sitting balance-Leahy Scale: Good     Standing balance support: Bilateral upper extremity supported Standing balance-Leahy Scale: Zero                             ADL either performed or assessed with clinical judgement   ADL Overall ADL's : Needs assistance/impaired                                       General ADL Comments: MAX A x2 pericare standing. MOD A don/doff gown in sitting.    Extremity/Trunk Assessment              Vision       Restaurant manager, fast food Communication: No apparent difficulties   Cognition Arousal: Alert Behavior During Therapy: WFL for tasks assessed/performed Cognition: No apparent impairments                               Following commands: Intact        Cueing   Cueing Techniques: Verbal cues, Tactile cues  Exercises      Shoulder Instructions       General Comments      Pertinent Vitals/ Pain       Pain Assessment Pain Assessment: Faces Faces Pain Scale: Hurts little more Pain Location: R knee Pain Descriptors / Indicators: Aching  Pain Intervention(s): Limited activity within patient's tolerance, Repositioned  Home Living                                          Prior Functioning/Environment              Frequency  Min 2X/week        Progress Toward Goals  OT Goals(current goals can now be found in the care plan section)  Progress towards OT goals: Progressing toward goals  Acute Rehab OT Goals OT Goal Formulation: With patient Time For Goal Achievement: 12/25/23 Potential to Achieve Goals: Fair ADL Goals Pt Will Perform Grooming: sitting;with set-up Pt Will Perform Lower Body Dressing: with mod assist;with adaptive equipment Pt Will Transfer to Toilet: bedside commode;with mod assist Pt Will Perform Toileting -  Clothing Manipulation and hygiene: with mod assist;sitting/lateral leans  Plan      Co-evaluation    PT/OT/SLP Co-Evaluation/Treatment: Yes Reason for Co-Treatment: Complexity of the patient's impairments (multi-system involvement);For patient/therapist safety;To address functional/ADL transfers PT goals addressed during session: Mobility/safety with mobility OT goals addressed during session: ADL's and self-care      AM-PAC OT "6 Clicks" Daily Activity     Outcome Measure   Help from another person eating meals?: A Little Help from another person taking care of personal grooming?: A Lot Help from another person toileting, which includes using toliet, bedpan, or urinal?: A Lot Help from another person bathing (including washing, rinsing, drying)?: A Lot Help from another person to put on and taking off regular upper body clothing?: A Little Help from another person to put on and taking off regular lower body clothing?: A Lot 6 Click Score: 14    End of Session    OT Visit Diagnosis: Other abnormalities of gait and mobility (R26.89);Muscle weakness (generalized) (M62.81);History of falling (Z91.81)   Activity Tolerance Patient tolerated treatment well   Patient Left in bed;with call bell/phone within reach;with bed alarm set   Nurse Communication          Time: 1610-9604 OT Time Calculation (min): 26 min  Charges: OT General Charges $OT Visit: 1 Visit OT Treatments $Self Care/Home Management : 8-22 mins  Gordan Latina, M.S. OTR/L  12/15/23, 1:45 PM  ascom [phone]

## 2023-12-15 NOTE — Plan of Care (Signed)
   Problem: Education: Goal: Knowledge of condition and prescribed therapy will improve Outcome: Progressing   Problem: Cardiac: Goal: Will achieve and/or maintain adequate cardiac output Outcome: Progressing

## 2023-12-15 NOTE — Assessment & Plan Note (Addendum)
 Last hemoglobin 10.0, recommend checking a hemoglobin next week.  No signs of bleeding.

## 2023-12-16 DIAGNOSIS — E875 Hyperkalemia: Secondary | ICD-10-CM | POA: Insufficient documentation

## 2023-12-16 DIAGNOSIS — I340 Nonrheumatic mitral (valve) insufficiency: Secondary | ICD-10-CM | POA: Diagnosis not present

## 2023-12-16 DIAGNOSIS — I060 Rheumatic aortic stenosis: Secondary | ICD-10-CM

## 2023-12-16 DIAGNOSIS — M25561 Pain in right knee: Secondary | ICD-10-CM | POA: Diagnosis not present

## 2023-12-16 DIAGNOSIS — N3000 Acute cystitis without hematuria: Secondary | ICD-10-CM | POA: Diagnosis not present

## 2023-12-16 LAB — BASIC METABOLIC PANEL WITH GFR
Anion gap: 6 (ref 5–15)
BUN: 23 mg/dL (ref 8–23)
CO2: 25 mmol/L (ref 22–32)
Calcium: 8.5 mg/dL — ABNORMAL LOW (ref 8.9–10.3)
Chloride: 102 mmol/L (ref 98–111)
Creatinine, Ser: 0.96 mg/dL (ref 0.44–1.00)
GFR, Estimated: 58 mL/min — ABNORMAL LOW (ref >60)
Glucose, Bld: 128 mg/dL — ABNORMAL HIGH (ref 70–99)
Potassium: 5.2 mmol/L — ABNORMAL HIGH (ref 3.5–5.1)
Sodium: 133 mmol/L — ABNORMAL LOW (ref 135–145)

## 2023-12-16 LAB — CBC
HCT: 28.1 % — ABNORMAL LOW (ref 36.0–46.0)
Hemoglobin: 9.5 g/dL — ABNORMAL LOW (ref 12.0–15.0)
MCH: 31.6 pg (ref 26.0–34.0)
MCHC: 33.8 g/dL (ref 30.0–36.0)
MCV: 93.4 fL (ref 80.0–100.0)
Platelets: 178 10*3/uL (ref 150–400)
RBC: 3.01 MIL/uL — ABNORMAL LOW (ref 3.87–5.11)
RDW: 12.5 % (ref 11.5–15.5)
WBC: 6.4 10*3/uL (ref 4.0–10.5)
nRBC: 0 % (ref 0.0–0.2)

## 2023-12-16 LAB — URIC ACID: Uric Acid, Serum: 6.1 mg/dL (ref 2.5–7.1)

## 2023-12-16 LAB — POTASSIUM: Potassium: 4.4 mmol/L (ref 3.5–5.1)

## 2023-12-16 LAB — HEMOGLOBIN: Hemoglobin: 10 g/dL — ABNORMAL LOW (ref 12.0–15.0)

## 2023-12-16 LAB — GLUCOSE, CAPILLARY: Glucose-Capillary: 136 mg/dL — ABNORMAL HIGH (ref 70–99)

## 2023-12-16 MED ORDER — PREDNISONE 10 MG PO TABS: ORAL_TABLET | ORAL | 0 refills | Status: AC

## 2023-12-16 MED ORDER — SODIUM ZIRCONIUM CYCLOSILICATE 10 G PO PACK
10.0000 g | PACK | Freq: Once | ORAL | Status: AC
  Administered 2023-12-16: 10 g via ORAL
  Filled 2023-12-16: qty 1

## 2023-12-16 NOTE — Discharge Summary (Signed)
 Physician Discharge Summary   Patient: Regina Ross MRN: [id number] DOB: 06/29/1936  Admit date:     12/10/2023  Discharge date: 12/16/23  Discharge Physician: Verla Glaze   PCP: Sharyne Degree, FNP   Recommendations at discharge:   Follow-up team at rehab 1 day Follow-up Dr. Clyda Dark orthopedics 2 weeks Check hemoglobin and BMP next week.  Discharge Diagnoses: Active Problems:   Right knee pain   Severe mitral regurgitation   Aortic valve stenosis   Urinary tract infection   Ambulatory dysfunction   Decubitus ulcers   HTN (hypertension)   Iron deficiency anemia   Myocardial injury   Fall at home   Osteoarthritis of right hip   Osteoarthritis of both knees   Hyperkalemia   Hospital Course: 88 y.o. female with medical history significant for severe mitral regurgitation, aortic valve stenosis, HTN, HLD, frequent falls, OA bilateral knees, CKD llla, who presents to the ED for evaluation of a difficulty ambulating since a fall a week prior.  At baseline she ambulates with a walker and states that a week ago she was trying to put something in a drawer and lost her balance and fell.  Since then she has been essentially bedbound and unable to walk and now is having soreness on her buttocks that is prompting a decision to come to the emergency room to get checked out.    CT knee right: No acute fracture or dislocation. 2. Moderate joint effusion. 3. Edema and stranding in the region of the medial collateral ligament and medial patellar retinaculum. Correlate clinically for injury. 4. Tricompartmental degenerative changes, most significant in the lateral compartment.  5/21.  Received insurance authorization for rehab at facility does not have a bed today.  Patient complained of knee pain.  Ice pack, diclofenac  gel.  1 dose of Toradol.  Will give a dose of Solu-Medrol and see if she responds.  Follow-up with orthopedic as outpatient. 5/22.  This morning's labs showed a  hemoglobin of 9.5 and a potassium of 5.2.  Patient given Lokelma.  Repeat potassium 4.4 and repeat hemoglobin 10.0.  Stable for discharge to rehab.  Since pain better with Solu-Medrol will give a prednisone taper.  Assessment and Plan: Right knee pain Seen by orthopedic surgery recommended follow-up as outpatient.  Patient on oxycodone  and diclofenac  gel.  Pain improved with Solu-Medrol.  Will give a prednisone taper upon going out to rehab.  Continue working with physical therapy.  Aortic valve stenosis History of severe mitral regurgitation and aortic valve stenosis Fall, low suspicion for syncope Follow-up with cardiology as outpatient.  Urinary tract infection Patient completed antibiotics  Ambulatory dysfunction History of a fall History of osteoarthritis Physical therapy recommending rehab  Decubitus ulcers Present on admission, see full description below.  Numerous decubiti.  Foam dressing change every 3 days and as needed for swelling.  And changing position routinely.  HTN (hypertension) Patient on atenolol  and doxazosin .  Iron deficiency anemia Last hemoglobin 10.0, recommend checking a hemoglobin next week.  No signs of bleeding.  Myocardial injury Troponin only borderline elevated  Hyperkalemia Patient given a dose of Lokelma.  Repeat potassium 4.4.  Recommend checking a BMP next week.         Consultants: Orthopedic surgery Procedures performed: None Disposition: Rehabilitation facility Diet recommendation:  Cardiac diet DISCHARGE MEDICATION: Allergies as of 12/16/2023   No Known Allergies      Medication List     STOP taking these medications    dapagliflozin  propanediol 5  MG Tabs tablet Commonly known as: FARXIGA    etodolac 200 MG capsule Commonly known as: LODINE   naproxen 500 MG tablet Commonly known as: NAPROSYN       TAKE these medications    acetaminophen  325 MG tablet Commonly known as: TYLENOL  Take 2 tablets (650 mg total)  by mouth every 6 (six) hours as needed for mild pain (pain score 1-3) or moderate pain (pain score 4-6) (or Fever >/= 101).   aspirin  EC 81 MG tablet Take 81 mg by mouth daily.   atenolol  50 MG tablet Commonly known as: TENORMIN  Take 50 mg by mouth daily.   diclofenac  Sodium 1 % Gel Commonly known as: VOLTAREN  Apply 2 gm three times a day to areas of pain   doxazosin  4 MG tablet Commonly known as: CARDURA  Take 4 mg by mouth daily.   meclizine  25 MG tablet Commonly known as: ANTIVERT  Take 25 mg by mouth daily.   oxyCODONE  5 MG immediate release tablet Commonly known as: Oxy IR/ROXICODONE  Take 1 tablet (5 mg total) by mouth every 6 (six) hours as needed for severe pain (pain score 7-10).   predniSONE 10 MG tablet Commonly known as: DELTASONE 3 tabs po day 1; 2 tabs po day 2,3; 1 tab po day 4,5; 1/2 tab po day 6,7 Start taking on: Dec 17, 2023   simvastatin  20 MG tablet Commonly known as: ZOCOR  Take 20 mg by mouth at bedtime.   Systane Balance 0.6 % Soln Generic drug: Propylene Glycol Apply 1 drop to eye as needed (Dry Eyes).        Contact information for follow-up providers     Sharyne Degree, FNP .   Specialty: Family Medicine Contact information: [address] [place] [address] [phone]         Venus Ginsberg, MD Follow up in 2 week(s).   Specialty: Orthopedic Surgery Contact information: [address] [place] [address] [phone]              Contact information for after-discharge care     Destination     [facility] .   Service: Skilled Nursing Contact information: [address] [place]  [phone]                    Discharge Exam: Filed Weights   12/14/23 0500 12/15/23 0500 12/16/23 0441  Weight: 81.2 kg 82.4 kg 82.6 kg   Physical Exam HENT:     Head: Normocephalic.     Mouth/Throat:     Pharynx: No oropharyngeal exudate.  Eyes:      General: Lids are normal.     Conjunctiva/sclera: Conjunctivae normal.  Cardiovascular:     Rate and Rhythm: Normal rate and regular rhythm.     Heart sounds: S1 normal and S2 normal. Murmur heard.     Systolic murmur is present with a grade of 4/6.  Pulmonary:     Breath sounds: No decreased breath sounds, wheezing, rhonchi or rales.  Abdominal:     Palpations: Abdomen is soft.     Tenderness: There is no abdominal tenderness.  Musculoskeletal:     Right knee: Swelling present. Decreased range of motion.     Right lower leg: No swelling.     Left lower leg: No swelling.     Comments: Able to get better passive range of motion and then with her doing active range of motion.  Skin:    General: Skin is warm.  Neurological:     Mental Status: She is alert and oriented to person, place, and time.     Comments: Unable to straight leg raise with right leg.      Condition at discharge: stable  The results of significant diagnostics from this hospitalization (including imaging, microbiology, ancillary and laboratory) are listed below for reference.   Imaging Studies: ECHOCARDIOGRAM COMPLETE Result Date: 12/11/2023    ECHOCARDIOGRAM REPORT   Patient Name:   DRU LAUREL Date of Exam: 12/11/2023 Medical Rec #:  [id number]     Height:       67.0 in Accession #:    [id number]    Weight:       177.2 lb Date of Birth:  Sep 30, 1935     BSA:          1.921 m Patient Age:    87 years      BP:           119/63 mmHg Patient Gender: F             HR:           75 bpm. Exam Location:  [facility] Procedure: 2D Echo, Cardiac Doppler and Color Doppler (Both Spectral and Color            Flow Doppler were utilized during procedure). Indications:     Syncope R55  History:         Patient has no prior history of Echocardiogram examinations.  Sonographer:     Clenton Czech RDCS, FASE Referring Phys:  [id number] Lanetta Pion Diagnosing Phys: Lanell Pinta Custovic IMPRESSIONS  1. Left ventricular ejection fraction, by  estimation, is 60 to 65%. The left ventricle has normal function. The left ventricle has no regional wall motion abnormalities. Left ventricular diastolic parameters are consistent with Grade I diastolic dysfunction (impaired relaxation).  2. Right ventricular systolic function is normal. The right ventricular size is normal. There is normal pulmonary artery systolic pressure. The estimated right ventricular systolic pressure is 33.9 mmHg.  3. The mitral valve is normal in structure. Trivial mitral valve regurgitation.  4. The aortic valve is calcified. Aortic valve regurgitation is mild to moderate. Severe aortic valve stenosis. Aortic regurgitation PHT measures 414 msec. Aortic valve area, by VTI measures 0.78 cm. Aortic valve mean gradient measures 55.8 mmHg. Aortic valve Vmax measures 4.81 m/s.  5. The inferior vena cava is normal in size with greater than 50% respiratory variability, suggesting right atrial pressure of 3 mmHg. FINDINGS  Left Ventricle: Left ventricular ejection fraction, by estimation, is 60 to 65%. The left ventricle has normal function. The left ventricle has no regional wall motion abnormalities. The left ventricular internal cavity size was normal in size. There is  no left ventricular hypertrophy. Left ventricular diastolic parameters are consistent with Grade I diastolic dysfunction (impaired relaxation). Right Ventricle: The right ventricular size is normal. No increase in right ventricular wall thickness. Right ventricular systolic function is normal. There is normal pulmonary artery systolic pressure. The tricuspid regurgitant velocity is 2.78 m/s, and  with an assumed right atrial pressure of 3 mmHg, the estimated right ventricular systolic pressure is 33.9 mmHg. Left Atrium: Left atrial size was normal in size. Right Atrium: Right atrial size was normal in size. Pericardium: There is no evidence of pericardial effusion. Mitral Valve: The mitral valve is normal in structure. Trivial  mitral valve regurgitation. Tricuspid Valve: The tricuspid valve is normal in structure. Tricuspid valve regurgitation is mild. Aortic Valve: The aortic  valve is calcified. Aortic valve regurgitation is mild to moderate. Aortic regurgitation PHT measures 414 msec. Severe aortic stenosis is present. Aortic valve mean gradient measures 55.8 mmHg. Aortic valve peak gradient measures  92.4 mmHg. Aortic valve area, by VTI measures 0.78 cm. Pulmonic Valve: The pulmonic valve was normal in structure. Pulmonic valve regurgitation is not visualized. No evidence of pulmonic stenosis. Aorta: The aortic root is normal in size and structure. Venous: The inferior vena cava is normal in size with greater than 50% respiratory variability, suggesting right atrial pressure of 3 mmHg. IAS/Shunts: No atrial level shunt detected by color flow Doppler.  LEFT VENTRICLE PLAX 2D LVIDd:         4.20 cm   Diastology LVIDs:         2.90 cm   LV e' medial:    4.79 cm/s LV PW:         1.30 cm   LV E/e' medial:  10.9 LV IVS:        1.20 cm   LV e' lateral:   3.59 cm/s LVOT diam:     2.00 cm   LV E/e' lateral: 14.6 LV SV:         90 LV SV Index:   47 LVOT Area:     3.14 cm  RIGHT VENTRICLE RV Basal diam:  3.30 cm RV S prime:     14.00 cm/s TAPSE (M-mode): 2.2 cm LEFT ATRIUM           Index        RIGHT ATRIUM           Index LA diam:      3.40 cm 1.77 cm/m   RA Area:     10.60 cm LA Vol (A4C): 46.4 ml 24.15 ml/m  RA Volume:   24.90 ml  12.96 ml/m  AORTIC VALVE AV Area (Vmax):    0.84 cm AV Area (Vmean):   0.78 cm AV Area (VTI):     0.78 cm AV Vmax:           480.75 cm/s AV Vmean:          351.500 cm/s AV VTI:            1.160 m AV Peak Grad:      92.4 mmHg AV Mean Grad:      55.8 mmHg LVOT Vmax:         128.00 cm/s LVOT Vmean:        87.400 cm/s LVOT VTI:          0.288 m LVOT/AV VTI ratio: 0.25 AI PHT:            414 msec  AORTA Ao Root diam: 2.60 cm Ao Asc diam:  2.90 cm MITRAL VALVE                TRICUSPID VALVE MV Area (PHT): 2.91  cm     TR Peak grad:   30.9 mmHg MV Decel Time: 261 msec     TR Vmax:        278.00 cm/s MV E velocity: 52.40 cm/s MV A velocity: 125.00 cm/s  SHUNTS MV E/A ratio:  0.42         Systemic VTI:  0.29 m                             Systemic Diam: 2.00 cm Designer, multimedia signed by [employer]  Date/Time: 12/11/2023/2:28:32 PM    Final    CT KNEE LEFT WO CONTRAST Result Date: 12/10/2023 CLINICAL DATA:  Trauma EXAM: CT OF THE LEFT KNEE WITHOUT CONTRAST TECHNIQUE: Multidetector CT imaging of the left knee was performed according to the standard protocol. Multiplanar CT image reconstructions were also generated. RADIATION DOSE REDUCTION: This exam was performed according to the departmental dose-optimization program which includes automated exposure control, adjustment of the mA and/or kV according to patient size and/or use of iterative reconstruction technique. COMPARISON:  Left knee x-ray same day FINDINGS: Bones/Joint/Cartilage The bones are osteopenic. No focal osseous lesions are seen. There is no acute fracture or dislocation. There is tricompartmental joint space narrowing and osteophyte formation. This is most significant/severe in the lateral compartment. Moderate joint effusion present. Ligaments Suboptimally assessed by CT. Muscles and Tendons No intramuscular hematoma identified. There is edema and stranding in the region of the medial collateral ligament and medial patellar retinaculum. Soft tissues Peripheral vascular calcifications are present. IMPRESSION: 1. No acute fracture or dislocation. 2. Moderate joint effusion. 3. Edema and stranding in the region of the medial collateral ligament and medial patellar retinaculum. Correlate clinically for injury. 4. Tricompartmental degenerative changes, most significant in the lateral compartment. Electronically Signed   By: Tyron Gallon M.D.   On: 12/10/2023 23:29   CT Knee Right Wo Contrast Result Date: 12/10/2023 CLINICAL DATA:   Knee injury, occult fracture suspected EXAM: CT OF THE RIGHT KNEE WITHOUT CONTRAST TECHNIQUE: Multidetector CT imaging of the right knee was performed according to the standard protocol. Multiplanar CT image reconstructions were also generated. RADIATION DOSE REDUCTION: This exam was performed according to the departmental dose-optimization program which includes automated exposure control, adjustment of the mA and/or kV according to patient size and/or use of iterative reconstruction technique. COMPARISON:  Plain films today FINDINGS: Bones/Joint/Cartilage Advanced tricompartment osteoarthritis in the right knee with severe joint space narrowing and spurring. Small so stated joint effusion. No fracture visualized. No subluxation or dislocation. Ligaments Suboptimally assessed by CT. Muscles and Tendons Negative Soft tissues Negative IMPRESSION: No acute bony abnormality. Severe osteoarthritis with small joint effusion. Electronically Signed   By: Janeece Mechanic M.D.   On: 12/10/2023 23:27   DG HIP UNILAT WITH PELVIS 2-3 VIEWS RIGHT Result Date: 12/10/2023 CLINICAL DATA:  Fall EXAM: DG HIP (WITH OR WITHOUT PELVIS) 2-3V RIGHT COMPARISON:  None Available. FINDINGS: Prior left hip replacement. Advanced degenerative changes in the right hip. No acute bony abnormality. Specifically, no fracture, subluxation, or dislocation. IMPRESSION: No acute bony abnormality. Electronically Signed   By: Janeece Mechanic M.D.   On: 12/10/2023 19:30   DG Knee Complete 4 Views Left Result Date: 12/10/2023 CLINICAL DATA:  Left knee pain, swelling EXAM: LEFT KNEE - COMPLETE 4+ VIEW COMPARISON:  None Available. FINDINGS: Advanced tricompartment degenerative changes. No significant joint effusion. No acute bony abnormality. Specifically, no fracture, subluxation, or dislocation. IMPRESSION: Advanced degenerative changes.  No acute bony abnormality. Electronically Signed   By: Janeece Mechanic M.D.   On: 12/10/2023 19:29   CT Head Wo  Contrast Result Date: 12/10/2023 CLINICAL DATA:  Mental status change, unknown cause.  Fall. EXAM: CT HEAD WITHOUT CONTRAST TECHNIQUE: Contiguous axial images were obtained from the base of the skull through the vertex without intravenous contrast. RADIATION DOSE REDUCTION: This exam was performed according to the departmental dose-optimization program which includes automated exposure control, adjustment of the mA and/or kV according to patient size and/or use of iterative reconstruction technique. COMPARISON:  11/09/2023 FINDINGS:  Brain: There is atrophy and chronic small vessel disease changes. 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 No acute intracranial abnormality. Specifically, no hemorrhage, hydrocephalus, mass lesion, acute infarction, or significant intracranial injury. Vascular: No hyperdense vessel or unexpected calcification. Skull: No acute calvarial abnormality. Sinuses/Orbits: No acute findings Other: None IMPRESSION: Atrophy, chronic microvascular disease. No acute intracranial abnormality. Electronically Signed   By: Janeece Mechanic M.D.   On: 12/10/2023 19:29   DG Knee Complete 4 Views Right Result Date: 12/10/2023 CLINICAL DATA:  FALL EXAM: RIGHT KNEE - COMPLETE 4+ VIEW COMPARISON:  None Available. FINDINGS: THERE IS A SMALL JOINT EFFUSION. THERE IS NO ACUTE FRACTURE OR DISLOCATION IDENTIFIED. THERE IS SEVERE TRICOMPARTMENTAL OSTEOARTHROSIS WITH JOINT SPACE NARROWING AND OSTEOPHYTE FORMATION. IMPRESSION: 1. No acute fracture or dislocation. 2. Small joint effusion. 3. Severe tricompartmental osteoarthrosis. Electronically Signed   By: Tyron Gallon M.D.   On: 12/10/2023 16:59   DG Ankle Complete Left Result Date: 12/10/2023 CLINICAL DATA:  Fall and left ankle pain. EXAM: LEFT ANKLE COMPLETE - 3+ VIEW COMPARISON:  None Available. FINDINGS: There is no acute fracture or dislocation. The bones are osteopenic. The ankle mortise is intact. The soft tissues are  unremarkable. IMPRESSION: 1. No acute fracture or dislocation. 2. Osteopenia. Electronically Signed   By: Angus Bark M.D.   On: 12/10/2023 16:54    Microbiology: Results for orders placed or performed during the hospital encounter of 12/10/23  Urine Culture     Status: Abnormal   Collection Time: 12/11/23 12:27 AM   Specimen: Urine, Random  Result Value Ref Range Status   Specimen Description   Final    URINE, RANDOM Performed at [facility], [address] [place], [place] [address]    Special Requests   Final    NONE Reflexed from [id number] Performed at [facility], [address] [place], [place] [address]    Culture MULTIPLE SPECIES PRESENT, SUGGEST RECOLLECTION (A)  Final   Report Status 12/12/2023 FINAL  Final    Labs: CBC: Recent Labs  Lab 12/10/23 1939 12/16/23 0511 12/16/23 1053  WBC 8.5 6.4  --   NEUTROABS 5.5  --   --   HGB 11.1* 9.5* 10.0*  HCT 34.4* 28.1*  --   MCV 95.8 93.4  --   PLT 174 178  --    Basic Metabolic Panel: Recent Labs  Lab 12/10/23 1939 12/10/23 2114 12/16/23 0511 12/16/23 1053  NA 142  --  133*  --   K 3.9  --  5.2* 4.4  CL 107  --  102  --   CO2 25  --  25  --   GLUCOSE 108*  --  128*  --   BUN 18  --  23  --   CREATININE 0.76  --  0.96  --   CALCIUM 8.9  --  8.5*  --   MG  --  1.9  --   --    Liver Function Tests: Recent Labs  Lab 12/10/23 1939  AST 17  ALT 9  ALKPHOS 57  BILITOT 0.9  PROT 6.2*  ALBUMIN 3.0*   CBG: Recent Labs  Lab 12/11/23 0526 12/14/23 0423 12/15/23 0526 12/16/23 0445  GLUCAP 105* 113* 93 136*    Discharge time spent: greater than 30 minutes.  Signed: Verla Glaze, MD [facility] 12/16/2023

## 2023-12-16 NOTE — Progress Notes (Signed)
   12/16/23 1530  Medical Necessity for Transport Certificate --- IF THIS TRANSPORT IS ROUND TRIP OR SCHEDULED AND REPEATED, A PHYSICIAN MUST COMPLETE THIS FORM  Transport from: Scientist, product/process development) Geographical information systems officer to Scientist, product/process development)  Museum/gallery conservator)  Did the patient arrive from a Skilled Nursing Facility, Assisted Living Facility or Group Home? No  Date of Transport Service 12/16/23  Name of Transporting Agency [facility]  Scheduled Transport Time 3:30p - 4:00p  Round Trip Transport? No  Reason for Transport Discharge  Is this a hospice patient? No  Describe the Medical Condition Fall/observation  Q1 Are ALL the following "true"? 1. Patient unable to get up from bed without assistance  AND  2. Unable to ambulate  AND  3. Unable to sit in a chair, including wheelchair. Yes  Q2 Could the patient be transported safely by other means of transportation (I.E., wheelchair van)? No  Q3 Please check any of the following conditions that apply at the time of transport: Other (Comment) (uable to ambulate)  Electronic Signature Roxann Copper  Credentials RN  Date Signed 12/16/23

## 2023-12-16 NOTE — Progress Notes (Signed)
 Discharge orders met.

## 2023-12-16 NOTE — TOC Progression Note (Signed)
 Transition of Care [facility]) - Progression Note    Patient Details  Name: EZRIE BUNYAN MRN: [id number] Date of Birth: 02-Jun-1936  [facility] CM/SW Contact  Alexandra Ice, RN Phone Number: 12/16/2023, 8:44 AM  Clinical Narrative:    Sent a message to [facility] at [facility], asking if they will have bed available for patient, awaiting response.    Expected Discharge Plan: Skilled Nursing Facility Barriers to Discharge: Continued Medical Work up  Expected Discharge Plan and Services   Discharge Planning Services: CM Consult   Living arrangements for the past 2 months: Single Family Home                                       Social Determinants of Health (SDOH) Interventions SDOH Screenings   Food Insecurity: No Food Insecurity (12/11/2023)  Housing: Low Risk  (12/11/2023)  Transportation Needs: No Transportation Needs (12/11/2023)  Utilities: Not At Risk (12/11/2023)  Social Connections: Socially Isolated (12/11/2023)  Tobacco Use: Low Risk  (12/10/2023)    Readmission Risk Interventions     No data to display

## 2023-12-16 NOTE — Plan of Care (Signed)
  Problem: Education: Goal: Knowledge of condition and prescribed therapy will improve Outcome: Progressing   Problem: Physical Regulation: Goal: Complications related to the disease process, condition or treatment will be avoided or minimized Outcome: Progressing   Problem: Activity: Goal: Risk for activity intolerance will decrease Outcome: Progressing   Problem: Coping: Goal: Level of anxiety will decrease Outcome: Progressing

## 2023-12-16 NOTE — Assessment & Plan Note (Signed)
 Patient given a dose of Lokelma.  Repeat potassium 4.4.  Recommend checking a BMP next week.

## 2023-12-16 NOTE — TOC Transition Note (Signed)
 Transition of Care [facility]) - Discharge Note   Patient Details  Name: Regina Ross MRN: [id number] Date of Birth: 1936/06/29  [facility]) CM/SW Contact:  Alexandra Ice, RN Phone Number: 12/16/2023, 12:34 PM   Clinical Narrative:    Eldora Greet with Fabian Holster at [facility], they are able to accept patient today. Discharge order and summary sent via HUB. Spoke with Lonzell Robin at [facility] on schedule for pick up. Notified MD and bedside nurse. She is going to room 81A, call report to [phone]. EMS packet printed to nurse station.    Final next level of care: Skilled Nursing Facility Barriers to Discharge: Barriers Resolved   Patient Goals and CMS Choice Patient states their goals for this hospitalization and ongoing recovery are:: get stronger CMS Medicare.gov Compare Post Acute Care list provided to:: Patient Choice offered to / list presented to : Patient      Discharge Placement              Patient chooses bed at: [facility] Patient to be transferred to facility by: [facility] Name of family member notified: Cosette Dinning Patient and family notified of of transfer: 12/16/23  Discharge Plan and Services Additional resources added to the After Visit Summary for     Discharge Planning Services: CM Consult              DME Agency: NA       HH Arranged: NA          Social Drivers of Health (SDOH) Interventions SDOH Screenings   Food Insecurity: No Food Insecurity (12/11/2023)  Housing: Low Risk  (12/11/2023)  Transportation Needs: No Transportation Needs (12/11/2023)  Utilities: Not At Risk (12/11/2023)  Social Connections: Socially Isolated (12/11/2023)  Tobacco Use: Low Risk  (12/10/2023)     Readmission Risk Interventions     No data to display

## 2023-12-31 ENCOUNTER — Other Ambulatory Visit: Payer: Self-pay

## 2023-12-31 ENCOUNTER — Emergency Department

## 2023-12-31 ENCOUNTER — Emergency Department
Admission: EM | Admit: 2023-12-31 | Discharge: 2023-12-31 | Disposition: A | Discharge status: Home / Self Care | Attending: Emergency Medicine | Admitting: Emergency Medicine

## 2023-12-31 DIAGNOSIS — W01198A Fall on same level from slipping, tripping and stumbling with subsequent striking against other object, initial encounter: Secondary | ICD-10-CM | POA: Diagnosis not present

## 2023-12-31 DIAGNOSIS — I491 Atrial premature depolarization: Secondary | ICD-10-CM | POA: Diagnosis not present

## 2023-12-31 DIAGNOSIS — Z8673 Personal history of transient ischemic attack (TIA), and cerebral infarction without residual deficits: Secondary | ICD-10-CM | POA: Diagnosis not present

## 2023-12-31 DIAGNOSIS — W19XXXA Unspecified fall, initial encounter: Secondary | ICD-10-CM

## 2023-12-31 DIAGNOSIS — I10 Essential (primary) hypertension: Secondary | ICD-10-CM | POA: Diagnosis not present

## 2023-12-31 DIAGNOSIS — S0990XA Unspecified injury of head, initial encounter: Secondary | ICD-10-CM | POA: Diagnosis present

## 2023-12-31 LAB — CBC WITH DIFFERENTIAL/PLATELET
Abs Immature Granulocytes: 0.07 10*3/uL (ref 0.00–0.07)
Basophils Absolute: 0 10*3/uL (ref 0.0–0.1)
Basophils Relative: 0 %
Eosinophils Absolute: 0.1 10*3/uL (ref 0.0–0.5)
Eosinophils Relative: 1 %
HCT: 29.6 % — ABNORMAL LOW (ref 36.0–46.0)
Hemoglobin: 9.8 g/dL — ABNORMAL LOW (ref 12.0–15.0)
Immature Granulocytes: 1 %
Lymphocytes Relative: 11 %
Lymphs Abs: 1.1 10*3/uL (ref 0.7–4.0)
MCH: 31.3 pg (ref 26.0–34.0)
MCHC: 33.1 g/dL (ref 30.0–36.0)
MCV: 94.6 fL (ref 80.0–100.0)
Monocytes Absolute: 1 10*3/uL (ref 0.1–1.0)
Monocytes Relative: 10 %
Neutro Abs: 7.8 10*3/uL — ABNORMAL HIGH (ref 1.7–7.7)
Neutrophils Relative %: 77 %
Platelets: 128 10*3/uL — ABNORMAL LOW (ref 150–400)
RBC: 3.13 MIL/uL — ABNORMAL LOW (ref 3.87–5.11)
RDW: 13.4 % (ref 11.5–15.5)
WBC: 10 10*3/uL (ref 4.0–10.5)
nRBC: 0 % (ref 0.0–0.2)

## 2023-12-31 LAB — BASIC METABOLIC PANEL WITH GFR
Anion gap: 8 (ref 5–15)
BUN: 30 mg/dL — ABNORMAL HIGH (ref 8–23)
CO2: 26 mmol/L (ref 22–32)
Calcium: 8.4 mg/dL — ABNORMAL LOW (ref 8.9–10.3)
Chloride: 103 mmol/L (ref 98–111)
Creatinine, Ser: 0.81 mg/dL (ref 0.44–1.00)
GFR, Estimated: >60 mL/min (ref >60)
Glucose, Bld: 116 mg/dL — ABNORMAL HIGH (ref 70–99)
Potassium: 3.8 mmol/L (ref 3.5–5.1)
Sodium: 137 mmol/L (ref 135–145)

## 2023-12-31 NOTE — ED Notes (Signed)
 Called to [facility] @ 757 pm per RN Tiffany/[facility].

## 2023-12-31 NOTE — ED Notes (Signed)
 Patient denies pain at this time. Patient is oriented to name, birthdate and circumstances.

## 2023-12-31 NOTE — ED Notes (Signed)
 Pt incontinent of bladder. Pericare provided. New brief applied. Pt tolerated well. Stretcher in lowest position with wheels locked. Side rails up x 2. Call light within reach. VS rechecked and WNL at this time. Awaiting [facility] to return to [facility].

## 2023-12-31 NOTE — ED Provider Notes (Signed)
 [facility] Provider Note    Event Date/Time   First MD Initiated Contact with Patient 12/31/23 1735     (approximate)   History   Fall   HPI  Regina Ross is a 88 y.o. female history of hypertension, vertigo, arthritis, CVA presents emergency department from [facility] via EMS.  Patient states she does not walk.  Patient rolled out of bed and fell to the floor.  Was found by nursing staff.  Unsure if she hit her head however nursing staff noted a hematoma to the left side of her head.  She is unsure if she takes blood thinners.      Physical Exam   Triage Vital Signs: ED Triage Vitals  Encounter Vitals Group     BP 12/31/23 1713 (!) 120/52     Systolic BP Percentile --      Diastolic BP Percentile --      Pulse Rate 12/31/23 1713 60     Resp 12/31/23 1713 20     Temp 12/31/23 1713 97.8 F (36.6 C)     Temp Source 12/31/23 1713 Oral     SpO2 12/31/23 1713 98 %     Weight --      Height --      Head Circumference --      Peak Flow --      Pain Score 12/31/23 1714 3     Pain Loc --      Pain Education --      Exclude from Growth Chart --     Most recent vital signs: Vitals:   12/31/23 1713 12/31/23 1753  BP: (!) 120/52   Pulse: 60   Resp: 20   Temp: 97.8 F (36.6 C)   SpO2: 98% 98%     General: Awake, no distress.   CV:  Good peripheral perfusion. regular rate and  rhythm Resp:  Normal effort. Lungs cta Abd:  No distention.   Other:  Patient has a noted deficit from previous stroke on the right side, extremities are nontender, neurovascular appears to be intact   ED Results / Procedures / Treatments   Labs (all labs ordered are listed, but only abnormal results are displayed) Labs Reviewed  BASIC METABOLIC PANEL WITH GFR - Abnormal; Notable for the following components:      Result Value   Glucose, Bld 116 (*)    BUN 30 (*)    Calcium 8.4 (*)    All other components within normal limits  CBC WITH  DIFFERENTIAL/PLATELET - Abnormal; Notable for the following components:   RBC 3.13 (*)    Hemoglobin 9.8 (*)    HCT 29.6 (*)    Platelets 128 (*)    Neutro Abs 7.8 (*)    All other components within normal limits     EKG     RADIOLOGY CT of the head, C-spine    PROCEDURES:   Procedures  Critical Care: No Chief Complaint  Patient presents with   Fall      MEDICATIONS ORDERED IN ED: Medications - No data to display   IMPRESSION / MDM / ASSESSMENT AND PLAN / ED COURSE  I reviewed the triage vital signs and the nursing notes.                              Differential diagnosis includes, but is not limited to, subdural, SAH, CVA, C-spine fracture, strain, contusion  Patient's presentation is most consistent with acute presentation with potential threat to life or bodily function.   Cardiac monitor no Medications given-none  CT of the head and cervical spine independently reviewed interpreted by me as being negative for acute abnormality  CBC metabolic panel were reviewed by me and are reassuring  I did explain these findings to the patient.  She will need transport back to [facility].  Follow-up with her regular doctor as needed.  Return if worsening.  She is in agreement treatment plan.  Discharged stable condition.      FINAL CLINICAL IMPRESSION(S) / ED DIAGNOSES   Final diagnoses:  Fall, initial encounter  Minor head injury, initial encounter     Rx / DC Orders   ED Discharge Orders     None        Note:  This document was prepared using Dragon voice recognition software and may include unintentional dictation errors.    Delsie Figures, PA-C 12/31/23 2005    Bradler, Evan K, MD 01/01/24 2106

## 2023-12-31 NOTE — ED Notes (Signed)
 This RN went to check on patient at this time. Upon entering the room the patient was tearful and concerned about going home. This RN informed her that transport had been set up and her facility is aware that she will be coming back. Warm blankets and other comfort measures offered at this time which patient declined. Patient was more at ease once the RN gave her an update. Will continue to monitor.

## 2023-12-31 NOTE — ED Triage Notes (Signed)
 First nurse note: Pt to ED via [facility] from [facility]. Unwitnessed fall laying prone found by staff. Pt is A&Ox3 that is baseline. Pt hit her head on the ground. Hematoma to left side of her head. No LOC per pt. Unsure if she is on blood thinners.   EMS  148/48 100% RA CBG 118 HR 88

## 2023-12-31 NOTE — ED Notes (Signed)
[facility] here at this time
